# Patient Record
Sex: Male | Born: 1950 | Race: White | Hispanic: No | Marital: Married | Smoking: Former smoker
Health system: Southern US, Community
[De-identification: ages and names within clinical notes are randomized; demographics above are authoritative.]

## PROBLEM LIST (undated history)

## (undated) HISTORY — PX: TONSILLECTOMY: SUR1361

## (undated) HISTORY — PX: SHOULDER SURGERY: SHX246

## (undated) HISTORY — PX: WISDOM TOOTH EXTRACTION: SHX21

---

## 2006-09-19 ENCOUNTER — Ambulatory Visit: Payer: Self-pay | Admitting: Gastroenterology

## 2006-09-19 LAB — HM COLONOSCOPY

## 2009-09-04 ENCOUNTER — Ambulatory Visit: Payer: Self-pay | Admitting: Family Medicine

## 2014-02-05 ENCOUNTER — Encounter: Payer: Self-pay | Admitting: General Surgery

## 2014-02-05 ENCOUNTER — Ambulatory Visit (INDEPENDENT_AMBULATORY_CARE_PROVIDER_SITE_OTHER): Payer: 59 | Admitting: General Surgery

## 2014-02-05 VITALS — BP 126/74 | HR 80 | Resp 12 | Ht 73.0 in | Wt 178.0 lb

## 2014-02-05 DIAGNOSIS — L723 Sebaceous cyst: Secondary | ICD-10-CM

## 2014-02-05 HISTORY — PX: CYST EXCISION: SHX5701

## 2014-02-05 NOTE — Progress Notes (Signed)
Patient ID: Dennis Barrera, male   DOB: 04-24-1951, 63 y.o.   MRN: [id number]  Chief Complaint  Patient presents with  . Other    sebaceous cyst    HPI Dennis Barrera is a 63 y.o. male who presents for an evaluation of a right lower back sebaceous cyst. He states he noticed it approximately 4 years ago. In the last 4 months he started having some soreness and inflammation in this area. He has been able to squeeze this area and has noticed some drainage. The drainage is bloody in color at time and other time he has a yellowish discharge.   HPI  History reviewed. No pertinent past medical history.  History reviewed. No pertinent past surgical history.  History reviewed. No pertinent family history.  Social History History  Substance Use Topics  . Smoking status: Never Smoker   . Smokeless tobacco: Never Used  . Alcohol Use: Yes    No Known Allergies  Current Outpatient Prescriptions  Medication Sig Dispense Refill  . aspirin 81 MG tablet Take 81 mg by mouth daily.       No current facility-administered medications for this visit.    Review of Systems Review of Systems  Constitutional: Negative.   Respiratory: Negative.   Cardiovascular: Negative.     Blood pressure 126/74, pulse 80, resp. rate 12, height 6\' 1"  (1.854 m), weight 178 lb (80.74 kg).  Physical Exam Physical Exam  Skin:     1 cm sebaceous cyst present    Data Reviewed PCP Notes.  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    Plan    The area has occasionally drained thickened material suggestive of cyst contents. Elective excision was recommended and accepted. 10 cc of 0.5% Xylocaine with 0.25% Marcaine with 1-200,000 units of epinephrine was utilized well tolerated. The area was cleaned with ChloraPrep and draped. An elliptical incision was used to remove the cyst and the central pore. Was closed with interrupted 4-0 nylon sutures. Telfa and Tegaderm dressing.  Postoperative wound care reviewed.  The patient return in one week for suture removal to staff.    PCP/Ref. MD: Miguel Aschoff   Robert Bellow 02/06/2014, 8:18 PM

## 2014-02-05 NOTE — Patient Instructions (Signed)
Patient to return in 1 week for nurse visit. The patient is aware to call back for any questions or concerns.  

## 2014-02-06 DIAGNOSIS — L723 Sebaceous cyst: Secondary | ICD-10-CM | POA: Insufficient documentation

## 2014-02-08 LAB — PATHOLOGY

## 2014-02-11 ENCOUNTER — Telehealth: Payer: Self-pay

## 2014-02-11 NOTE — Telephone Encounter (Signed)
Message copied by Lesly Rubenstein on Mon Feb 11, 2014  4:31 PM ------      Message from: [place], Forest Gleason      Created: Mon Feb 11, 2014  2:11 PM       Clinical impression was that of a sebaceous cyst. No cyst wall identified on resection. We'll plan for a followup exam in 6 weeks.            Caryl-Lyn: please notify the patient the pathology was benign. I would like to have a brief position exam in 6 weeks to assess healing. Thank you ------

## 2014-02-11 NOTE — Telephone Encounter (Signed)
Message left for patient to call back for results.

## 2014-02-12 ENCOUNTER — Ambulatory Visit (INDEPENDENT_AMBULATORY_CARE_PROVIDER_SITE_OTHER): Payer: Self-pay | Admitting: *Deleted

## 2014-02-12 DIAGNOSIS — L723 Sebaceous cyst: Secondary | ICD-10-CM

## 2014-02-12 NOTE — Patient Instructions (Signed)
Bandaid as needed

## 2014-02-12 NOTE — Progress Notes (Addendum)
Patient came in today for a wound check.  The wound is clean, with no signs of infection noted. Sutures removed and steri strips applied. Follow up as scheduled.

## 2014-03-26 ENCOUNTER — Ambulatory Visit (INDEPENDENT_AMBULATORY_CARE_PROVIDER_SITE_OTHER): Payer: Self-pay | Admitting: General Surgery

## 2014-03-26 ENCOUNTER — Encounter: Payer: Self-pay | Admitting: General Surgery

## 2014-03-26 VITALS — BP 130/70 | HR 80 | Resp 12 | Ht 73.0 in | Wt 205.0 lb

## 2014-03-26 DIAGNOSIS — L723 Sebaceous cyst: Secondary | ICD-10-CM

## 2014-03-26 NOTE — Progress Notes (Signed)
Patient ID: Dennis Barrera, male   DOB: [birth year], 63 y.o.   MRN: [id number]  Chief Complaint  Patient presents with  . Follow-up    6 week post op lower back cyst excision    HPI Dennis Barrera is a 63 y.o. male who presents for a post op lower back cyst excision. The procedure was performed on 02/05/14. The patient is doing well. No new complaints at this time.   HPI  No past medical history on file.  Past Surgical History  Procedure Laterality Date  . Cyst excision  02/05/14    lower back    No family history on file.  Social History History  Substance Use Topics  . Smoking status: Never Smoker   . Smokeless tobacco: Never Used  . Alcohol Use: Yes    No Known Allergies  Current Outpatient Prescriptions  Medication Sig Dispense Refill  . aspirin 81 MG tablet Take 81 mg by mouth daily.     No current facility-administered medications for this visit.    Review of Systems Review of Systems  Blood pressure 130/70, pulse 80, resp. rate 12, height 6\' 1"  (1.854 m), weight 205 lb (92.987 kg).  Physical Exam Physical Exam  Constitutional: He is oriented to person, place, and time. He appears well-developed and well-nourished.  Neurological: He is alert and oriented to person, place, and time.  Skin: Skin is warm and dry.     Drainage is noted at the site.     Data Reviewed Diagnosis: RIGHT POSTERIOR BACK SEBACEOUS CYST: - SKIN AND SUBCUTIS WITHOUT EVIDENCE OF SEBACEOUS CYST. - NEGATIVE FOR DYSPLASIA AND MALIGNANCY. Note: There is extension of mature adipose tissue within the dermis suggestive of a lipoma. Multiple additional deeper H/E levels were examined. XDB/02/08/2014  Assessment    Persistent cyst wall, warranting reexcision.    Plan    The original pathology showed no cystic material, considering the ongoing drainage, I think the cyst wall is still present and would be best served by removal. The patient will find a time when it's  convenient to have this completed as an office procedure.    PCP/Ref MD:  Philemon Kingdom 03/28/2014, 6:35 AM

## 2014-03-26 NOTE — Patient Instructions (Signed)
The patient is aware to call back for any questions or concerns.  

## 2014-04-16 ENCOUNTER — Encounter: Payer: Self-pay | Admitting: General Surgery

## 2014-04-16 ENCOUNTER — Ambulatory Visit (INDEPENDENT_AMBULATORY_CARE_PROVIDER_SITE_OTHER): Payer: 59 | Admitting: General Surgery

## 2014-04-16 VITALS — BP 138/86 | HR 76 | Resp 12 | Ht 73.0 in | Wt 205.0 lb

## 2014-04-16 DIAGNOSIS — L723 Sebaceous cyst: Secondary | ICD-10-CM

## 2014-04-16 NOTE — Patient Instructions (Signed)
Keep area clean 

## 2014-04-16 NOTE — Progress Notes (Signed)
Patient ID: Dennis Barrera, male   DOB: Jul 02, 1950, 64 y.o.   MRN: [id number]  Chief Complaint  Patient presents with  . Procedure    reexcision lower back cyst    HPI Dennis Barrera is a 63 y.o. male here today for a re excision lower back cyst.  HPI  No past medical history on file.  Past Surgical History  Procedure Laterality Date  . Cyst excision  02/05/14    lower back    No family history on file.  Social History History  Substance Use Topics  . Smoking status: Never Smoker   . Smokeless tobacco: Never Used  . Alcohol Use: Yes    No Known Allergies  Current Outpatient Prescriptions  Medication Sig Dispense Refill  . aspirin 81 MG tablet Take 81 mg by mouth as needed.      No current facility-administered medications for this visit.    Review of Systems Review of Systems  Constitutional: Negative.   Respiratory: Negative.   Cardiovascular: Negative.     Blood pressure 138/86, pulse 76, resp. rate 12, height 6\' 1"  (1.854 m), weight 205 lb (92.987 kg).  Physical Exam Physical Exam Examination shows the previous drainage site to be well healed. Just to the right of this is a small punctate opening, source of previously noted drainage.  Data Reviewed Cyst wall not identified on original pathologic material.   Assessment    Residual dermal cyst.    Plan    The area was prepped with alcohol and 10 mL of 0.5% Xylocaine with 0.25% Marcaine with 1-200,000 epinephrine was utilized for local anesthesia well tolerated. ChloraPrep was applied to the skin. Through elliptical incision the old scar as well as the small punctate opening where drainage has been noted were removed. The cyst wall was identified and completely resected. The deep tissue was approximately with interrupted 3-0 Vicryl sutures. The skin was closed with interrupted 4-0 nylon sutures. A dry dressing with Telfa and Tegaderm was applied.  The patient [name] procedure well. He'll return in  one week for suture removal. He'll make use of Tylenol or OTC anti-inflammatories if needed for pain.    PCP:  Philemon Kingdom 04/17/2014, 10:45 AM

## 2014-04-23 ENCOUNTER — Ambulatory Visit (INDEPENDENT_AMBULATORY_CARE_PROVIDER_SITE_OTHER): Payer: Self-pay | Admitting: *Deleted

## 2014-04-23 DIAGNOSIS — L723 Sebaceous cyst: Secondary | ICD-10-CM

## 2014-04-23 NOTE — Progress Notes (Signed)
Patient came in today for a wound check/suture removal.  Sutures removed steri strips applied. The wound is clean, with no signs of infection noted. Follow up as scheduled.

## 2014-04-23 NOTE — Patient Instructions (Signed)
Keep area clean 

## 2014-10-24 DIAGNOSIS — M5030 Other cervical disc degeneration, unspecified cervical region: Secondary | ICD-10-CM | POA: Insufficient documentation

## 2014-10-24 DIAGNOSIS — M5412 Radiculopathy, cervical region: Secondary | ICD-10-CM | POA: Insufficient documentation

## 2014-10-24 DIAGNOSIS — M47812 Spondylosis without myelopathy or radiculopathy, cervical region: Secondary | ICD-10-CM | POA: Insufficient documentation

## 2014-10-25 ENCOUNTER — Ambulatory Visit: Payer: Self-pay | Admitting: Family Medicine

## 2014-10-25 ENCOUNTER — Encounter: Payer: Self-pay | Admitting: Physician Assistant

## 2014-10-25 ENCOUNTER — Ambulatory Visit (INDEPENDENT_AMBULATORY_CARE_PROVIDER_SITE_OTHER): Payer: 59

## 2014-10-25 VITALS — Temp 98.0°F

## 2014-10-25 DIAGNOSIS — Z23 Encounter for immunization: Secondary | ICD-10-CM

## 2015-05-29 ENCOUNTER — Ambulatory Visit (INDEPENDENT_AMBULATORY_CARE_PROVIDER_SITE_OTHER): Payer: 59 | Admitting: Family Medicine

## 2015-05-29 ENCOUNTER — Encounter: Payer: Self-pay | Admitting: Family Medicine

## 2015-05-29 VITALS — BP 110/66 | HR 78 | Temp 97.9°F | Resp 16 | Ht 73.0 in | Wt 195.0 lb

## 2015-05-29 DIAGNOSIS — Z125 Encounter for screening for malignant neoplasm of prostate: Secondary | ICD-10-CM | POA: Diagnosis not present

## 2015-05-29 DIAGNOSIS — Z0000 Encounter for general adult medical examination without abnormal findings: Secondary | ICD-10-CM | POA: Diagnosis not present

## 2015-05-29 DIAGNOSIS — Z1211 Encounter for screening for malignant neoplasm of colon: Secondary | ICD-10-CM | POA: Diagnosis not present

## 2015-05-29 DIAGNOSIS — N529 Male erectile dysfunction, unspecified: Secondary | ICD-10-CM | POA: Diagnosis not present

## 2015-05-29 LAB — POCT URINALYSIS DIPSTICK
BILIRUBIN UA: NEGATIVE
Blood, UA: NEGATIVE
GLUCOSE UA: NEGATIVE
Ketones, UA: NEGATIVE
LEUKOCYTES UA: NEGATIVE
NITRITE UA: NEGATIVE
Protein, UA: NEGATIVE
Spec Grav, UA: 1.025
Urobilinogen, UA: NEGATIVE
pH, UA: 7

## 2015-05-29 LAB — IFOBT (OCCULT BLOOD): IFOBT: NEGATIVE

## 2015-05-29 MED ORDER — SILDENAFIL CITRATE 20 MG PO TABS: ORAL_TABLET | ORAL | Status: DC

## 2015-05-29 NOTE — Progress Notes (Signed)
Patient ID: Dennis Barrera, male   DOB: 1950-09-18, 65 y.o.   MRN: [id number]  Visit Date: 05/29/2015  Today's Provider: [name], MD   Chief Complaint  Patient presents with  . Annual Exam   Subjective:  Dennis Barrera is a 65 y.o. male who presents today for health maintenance and complete physical. He feels well. He reports exercising not much-walking a few times a week.Marland Kitchen He reports he is sleeping well. LAST Tdap 10/25/14.  No flu shot this year.  Colonoscopy 09/19/06 normal.  Review of Systems  Constitutional: Negative.   HENT: Positive for congestion.   Eyes: Negative.   Respiratory: Negative.   Cardiovascular: Negative.   Gastrointestinal: Negative.   Endocrine: Negative.   Genitourinary: Negative.   Musculoskeletal: Positive for back pain.  Allergic/Immunologic: Negative.   Neurological: Negative.   Hematological: Negative.   Psychiatric/Behavioral: Negative.     Social History   Social History  . Marital Status: Married    Spouse Name: N/A  . Number of Children: N/A  . Years of Education: N/A   Occupational History  . Not on file.   Social History Main Topics  . Smoking status: Never Smoker   . Smokeless tobacco: Never Used  . Alcohol Use: Yes  . Drug Use: No  . Sexual Activity: Not on file   Other Topics Concern  . Not on file   Social History Narrative    Patient Active Problem List   Diagnosis Date Noted  . Cervical spondylosis without myelopathy 10/24/2014  . 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 Degeneration of cervical intervertebral disc 10/24/2014  . Sebaceous cyst 02/06/2014    Past Surgical History  Procedure Laterality Date  . Cyst excision  02/05/14    lower back  . Tonsillectomy    . Wisdom tooth extraction    . Shoulder surgery      His family history includes Heart disease in his maternal grandfather; Hypertension in his brother and mother; Osteoarthritis in his father; Stroke in his father.     Outpatient Prescriptions Prior to Visit  Medication Sig Dispense Refill  . aspirin 81 MG tablet Take 81 mg by mouth as needed.      No facility-administered medications prior to visit.    Patient Care Team: Jerrol Banana., MD as PCP - General (Family Medicine) Jerrol Banana., MD (Family Medicine) Robert Bellow, MD (General Surgery)     Objective:   Vitals:  Filed Vitals:   05/29/15 1040  BP: 110/66  Pulse: 78  Temp: 97.9 F (36.6 C)  Resp: 16  Height: 6\' 1"  (1.854 m)  Weight: 195 lb (88.451 kg)    Physical Exam  Constitutional: He is oriented to person, place, and time. He appears well-developed and well-nourished.  HENT:  Head: Normocephalic and atraumatic.  Right Ear: External ear normal.  Left Ear: External ear normal.  Nose: Nose normal.  Mouth/Throat: Oropharynx is clear and moist.  Eyes: Conjunctivae are normal. Pupils are equal, round, and reactive to light.  Neck: Normal range of motion. Neck supple.  Cardiovascular: Normal rate, regular rhythm, normal heart sounds and intact distal pulses.   Pulmonary/Chest: Effort normal and breath sounds normal. No respiratory distress. He has no wheezes.  Abdominal: Soft. Bowel sounds are normal. He exhibits no distension. There is no tenderness.  Genitourinary: Rectum normal, prostate normal and penis normal. Guaiac negative stool. No penile tenderness.  Musculoskeletal: Normal range of motion. He exhibits no edema or  tenderness.  Neurological: He is alert and oriented to person, place, and time.  Skin: Skin is warm and dry. No rash noted.  Psychiatric: He has a normal mood and affect. His behavior is normal. Judgment and thought content normal.     Depression Screen PHQ 2/9 Scores 05/29/2015  PHQ - 2 Score 0      Assessment & Plan:   1. Annual physical exam Need to get shingels shot on the next visit. - CBC with Differential/Platelet - Lipid Panel With LDL/HDL Ratio - POCT urinalysis  dipstick - TSH - Comprehensive metabolic panel Patient's wife retired January 1.patient will probably retire in the next 2-3 years. 2. Colon cancer screening - IFOBT POC (occult bld, rslt in office)  3. Prostate cancer screening - PSA  4. Erectile dysfunction, unspecified erectile dysfunction type Discussed risk and benefits of Viagra at length with patient. Start medication. Never tried anything else. - sildenafil (REVATIO) 20 MG tablet; 2 to 4 tablets daily as needed  Dispense: 50 tablet; Refill: 5    Patient was seen and examined by Dr. Eulas Post and note was scribed by Theressa Millard, RMA.

## 2015-06-06 LAB — COMPREHENSIVE METABOLIC PANEL
ALK PHOS: 75 IU/L (ref 39–117)
ALT: 11 IU/L (ref 0–44)
AST: 16 IU/L (ref 0–40)
Albumin/Globulin Ratio: 2 (ref 1.1–2.5)
Albumin: 4.4 g/dL (ref 3.6–4.8)
BILIRUBIN TOTAL: 0.6 mg/dL (ref 0.0–1.2)
BUN/Creatinine Ratio: 13 (ref 10–22)
BUN: 12 mg/dL (ref 8–27)
CHLORIDE: 101 mmol/L (ref 96–106)
CO2: 22 mmol/L (ref 18–29)
CREATININE: 0.92 mg/dL (ref 0.76–1.27)
Calcium: 9.1 mg/dL (ref 8.6–10.2)
GFR calc Af Amer: 102 mL/min/{1.73_m2} (ref >59)
GFR calc non Af Amer: 88 mL/min/{1.73_m2} (ref >59)
GLUCOSE: 113 mg/dL — AB (ref 65–99)
Globulin, Total: 2.2 g/dL (ref 1.5–4.5)
Potassium: 4.5 mmol/L (ref 3.5–5.2)
SODIUM: 141 mmol/L (ref 134–144)
Total Protein: 6.6 g/dL (ref 6.0–8.5)

## 2015-06-06 LAB — LIPID PANEL WITH LDL/HDL RATIO
CHOLESTEROL TOTAL: 209 mg/dL — AB (ref 100–199)
HDL: 50 mg/dL (ref >39)
LDL Calculated: 141 mg/dL — ABNORMAL HIGH (ref 0–99)
LDl/HDL Ratio: 2.8 ratio units (ref 0.0–3.6)
TRIGLYCERIDES: 90 mg/dL (ref 0–149)
VLDL Cholesterol Cal: 18 mg/dL (ref 5–40)

## 2015-06-06 LAB — CBC WITH DIFFERENTIAL/PLATELET
BASOS: 1 %
Basophils Absolute: 0.1 10*3/uL (ref 0.0–0.2)
EOS (ABSOLUTE): 0.1 10*3/uL (ref 0.0–0.4)
Eos: 2 %
Hematocrit: 43.3 % (ref 37.5–51.0)
Hemoglobin: 14.5 g/dL (ref 12.6–17.7)
Immature Grans (Abs): 0 10*3/uL (ref 0.0–0.1)
Immature Granulocytes: 0 %
LYMPHS: 34 %
Lymphocytes Absolute: 1.8 10*3/uL (ref 0.7–3.1)
MCH: 28.2 pg (ref 26.6–33.0)
MCHC: 33.5 g/dL (ref 31.5–35.7)
MCV: 84 fL (ref 79–97)
MONOCYTES: 7 %
Monocytes Absolute: 0.3 10*3/uL (ref 0.1–0.9)
NEUTROS PCT: 56 %
Neutrophils Absolute: 3 10*3/uL (ref 1.4–7.0)
Platelets: 336 10*3/uL (ref 150–379)
RBC: 5.15 x10E6/uL (ref 4.14–5.80)
RDW: 13.1 % (ref 12.3–15.4)
WBC: 5.3 10*3/uL (ref 3.4–10.8)

## 2015-06-06 LAB — PSA: Prostate Specific Ag, Serum: 1.1 ng/mL (ref 0.0–4.0)

## 2015-06-06 LAB — TSH: TSH: 1.93 u[IU]/mL (ref 0.450–4.500)

## 2015-06-10 ENCOUNTER — Telehealth: Payer: Self-pay

## 2015-06-10 NOTE — Telephone Encounter (Signed)
Advised patient as of lab results. Patient would like to work on diet and exercise first before starting any medication.

## 2015-06-10 NOTE — Telephone Encounter (Signed)
Left message to call back  

## 2015-06-10 NOTE — Telephone Encounter (Signed)
-----   Message from Jerrol Banana., MD sent at 06/10/2015  8:14 AM EST ----- Labs stable. Cholesterol mildly elevated and patient is prediabetic. Diet and exercise as discussed during visit. It will be a reasonable continue consider metformin for the prediabetes as this could slow this issue down from becoming diabetes proper.

## 2015-07-22 ENCOUNTER — Ambulatory Visit
Admission: RE | Admit: 2015-07-22 | Discharge: 2015-07-22 | Disposition: A | Discharge status: Home / Self Care | Payer: 59 | Source: Ambulatory Visit | Attending: Family Medicine | Admitting: Family Medicine

## 2015-07-22 ENCOUNTER — Encounter: Payer: Self-pay | Admitting: Family Medicine

## 2015-07-22 ENCOUNTER — Ambulatory Visit (INDEPENDENT_AMBULATORY_CARE_PROVIDER_SITE_OTHER): Payer: 59 | Admitting: Family Medicine

## 2015-07-22 ENCOUNTER — Telehealth: Payer: Self-pay | Admitting: Family Medicine

## 2015-07-22 VITALS — BP 118/72 | HR 78 | Temp 98.4°F | Resp 18 | Wt 195.0 lb

## 2015-07-22 DIAGNOSIS — M25552 Pain in left hip: Secondary | ICD-10-CM | POA: Insufficient documentation

## 2015-07-22 DIAGNOSIS — M545 Low back pain: Secondary | ICD-10-CM | POA: Diagnosis not present

## 2015-07-22 MED ORDER — NAPROXEN 500 MG PO TABS: 500.0000 mg | ORAL_TABLET | Freq: Two times a day (BID) | ORAL | Status: DC

## 2015-07-22 MED ORDER — CYCLOBENZAPRINE HCL 10 MG PO TABS: 10.0000 mg | ORAL_TABLET | Freq: Every day | ORAL | Status: DC

## 2015-07-22 NOTE — Progress Notes (Signed)
Patient ID: Dennis Barrera, male   DOB: [birth year], 65 y.o.   MRN: [id number]    Subjective:  HPI Pt is here today for back pain. He has had back pain in the past, the initial injury was about 9 months ago. He was working in the yard, twisting and pulling. He came in, but not have x-rays but he was able to relief some the pain with back stretches. He reports that about 3 weeks ago the pain got worse. He can not remember anything that would have aggravated it again other than some stretches which seem to make it worse this time. He reports that this time he also has a pain that will shoot into his hip. It is a sharp shooting pain and sometimes it makes him stop in his tracks and last for a little while. Last night he could not get it to go away, he could not get comfortable to sleep.    Prior to Admission medications   Medication Sig Start Date End Date Taking? Authorizing Provider  aspirin 81 MG tablet Take 81 mg by mouth as needed.    Yes Historical Provider, MD  Naproxen Sodium (ALEVE) 220 MG CAPS Take by mouth 1 day or 1 dose.   Yes Historical Provider, MD    Patient Active Problem List   Diagnosis Date Noted  . Cervical spondylosis without myelopathy 10/24/2014  . 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 Degeneration of cervical intervertebral disc 10/24/2014  . Sebaceous cyst 02/06/2014    History reviewed. No pertinent past medical history.  Social History   Social History  . Marital Status: Married    Spouse Name: N/A  . Number of Children: N/A  . Years of Education: N/A   Occupational History  . Not on file.   Social History Main Topics  . Smoking status: Never Smoker   . Smokeless tobacco: Never Used  . Alcohol Use: Yes  . Drug Use: No  . Sexual Activity: Not on file   Other Topics Concern  . Not on file   Social History Narrative    No Known Allergies  Review of Systems  Constitutional: Negative.   HENT: Negative.   Eyes: Negative.   Respiratory:  Negative.   Cardiovascular: Negative.   Gastrointestinal: Negative.   Genitourinary: Negative.   Musculoskeletal: Positive for back pain.  Skin: Negative.   Neurological: Negative.   Endo/Heme/Allergies: Negative.   Psychiatric/Behavioral: Negative.     Immunization History  Administered Date(s) Administered  . Tdap 10/25/2014   Objective:  BP 118/72 mmHg  Pulse 78  Temp(Src) 98.4 F (36.9 C) (Oral)  Resp 18  Wt 195 lb (88.451 kg)  Physical Exam  Constitutional: He is oriented to person, place, and time and well-developed, well-nourished, and in no distress.  HENT:  Head: Normocephalic and atraumatic.  Right Ear: External ear normal.  Left Ear: External ear normal.  Nose: Nose normal.  Eyes: Conjunctivae and EOM are normal. Pupils are equal, round, and reactive to light.  Neck: Normal range of motion. Neck supple.  Cardiovascular: Normal rate, regular rhythm, normal heart sounds and intact distal pulses.   Pulmonary/Chest: Effort normal and breath sounds normal.  Abdominal: Soft.  Musculoskeletal: Normal range of motion.  Figure 4 negative, leg raise negative. Strength normal. Limited flexion.  Neurological: He is alert and oriented to person, place, and time. He has normal reflexes. Gait normal. GCS score is 15.  Skin: Skin is warm and dry.  Psychiatric: Mood, memory, affect  and judgment normal.    Lab Results  Component Value Date   WBC 5.3 06/05/2015   HCT 43.3 06/05/2015   PLT 336 06/05/2015   GLUCOSE 113* 06/05/2015   CHOL 209* 06/05/2015   TRIG 90 06/05/2015   HDL 50 06/05/2015   LDLCALC 141* 06/05/2015   TSH 1.930 06/05/2015    CMP     Component Value Date/Time   NA 141 06/05/2015 0842   K 4.5 06/05/2015 0842   CL 101 06/05/2015 0842   CO2 22 06/05/2015 0842   GLUCOSE 113* 06/05/2015 0842   BUN 12 06/05/2015 0842   CREATININE 0.92 06/05/2015 0842   CALCIUM 9.1 06/05/2015 0842   PROT 6.6 06/05/2015 0842   ALBUMIN 4.4 06/05/2015 0842   AST 16  06/05/2015 0842   ALT 11 06/05/2015 0842   ALKPHOS 75 06/05/2015 0842   BILITOT 0.6 06/05/2015 0842   GFRNONAA 88 06/05/2015 0842   GFRAA 102 06/05/2015 0842     Assessment and Plan :  1. Low back pain, unspecified back pain laterality, with sciatica presence unspecified  - DG Lumbar Spine 2-3 Views; Future - DG HIP INFANT UNILAT W OR W/O PELVIS 2-3 VIEWS LEFT; Future - naproxen (NAPROSYN) 500 MG tablet; Take 1 tablet (500 mg total) by mouth 2 (two) times daily with a meal.  Dispense: 60 tablet; Refill: 4 - cyclobenzaprine (FLEXERIL) 10 MG tablet; Take 1 tablet (10 mg total) by mouth at bedtime.  Dispense: 30 tablet; Refill: 4  2. Hip pain, left  - DG Lumbar Spine 2-3 Views; Future - DG HIP INFANT UNILAT W OR W/O PELVIS 2-3 VIEWS LEFT; Future - naproxen (NAPROSYN) 500 MG tablet; Take 1 tablet (500 mg total) by mouth 2 (two) times daily with a meal.  Dispense: 60 tablet; Refill: 4  May need referral to ortho or PT depending on xray results. Discussed risk of NSAID use.   Patient was seen and examined by Dr. Miguel Aschoff, and noted scribed by Webb Laws, [place] MD [facility] 07/22/2015 9:03 AM

## 2015-08-26 ENCOUNTER — Ambulatory Visit (INDEPENDENT_AMBULATORY_CARE_PROVIDER_SITE_OTHER): Payer: 59 | Admitting: Family Medicine

## 2015-08-26 VITALS — BP 124/76 | HR 84 | Resp 16 | Wt 198.0 lb

## 2015-08-26 DIAGNOSIS — M25552 Pain in left hip: Secondary | ICD-10-CM

## 2015-08-26 DIAGNOSIS — M545 Low back pain: Secondary | ICD-10-CM

## 2015-08-26 NOTE — Progress Notes (Signed)
Patient ID: Dennis Barrera, male   DOB: Sep 28, 1950, 65 y.o.   MRN: [id number]    Subjective:  HPI  Patient is here for follow up on his back pain. Last visit was in March 21st and he was started on Naproxen and has been taking it twice daily. He did not have to take Flexeril so far. Lumbar Spine and Hip xray was done and showed arthritis and DDD. Patient states he is 64% better but he still has episodes of pain shooting mainly through the left side of the back, hip and leg.   Prior to Admission medications   Medication Sig Start Date End Date Taking? Authorizing Provider  aspirin 81 MG tablet Take 81 mg by mouth as needed.    Yes Historical Provider, MD  naproxen (NAPROSYN) 500 MG tablet Take 1 tablet (500 mg total) by mouth 2 (two) times daily with a meal. 07/22/15  Yes Jerrol Banana., MD  cyclobenzaprine (FLEXERIL) 10 MG tablet Take 1 tablet (10 mg total) by mouth at bedtime. Patient not taking: Reported on 08/26/2015 07/22/15   Jerrol Banana., MD    Patient Active Problem List   Diagnosis Date Noted  . Cervical spondylosis without myelopathy 10/24/2014  . 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 Degeneration of cervical intervertebral disc 10/24/2014  . Sebaceous cyst 02/06/2014    No past medical history on file.  Social History   Social History  . Marital Status: Married    Spouse Name: N/A  . Number of Children: N/A  . Years of Education: N/A   Occupational History  . Not on file.   Social History Main Topics  . Smoking status: Never Smoker   . Smokeless tobacco: Never Used  . Alcohol Use: Yes  . Drug Use: No  . Sexual Activity: Not on file   Other Topics Concern  . Not on file   Social History Narrative    No Known Allergies  Review of Systems  Constitutional: Negative.   Respiratory: Negative.   Cardiovascular: Negative.   Musculoskeletal: Positive for back pain and joint pain.    Immunization History  Administered Date(s)  Administered  . Tdap 10/25/2014   Objective:  BP 124/76 mmHg  Pulse 84  Resp 16  Wt 198 lb (89.812 kg)  Physical Exam  Constitutional: He is oriented to person, place, and time and well-developed, well-nourished, and in no distress.  Cardiovascular: Normal rate, regular rhythm, normal heart sounds and intact distal pulses.   No murmur heard. Pulmonary/Chest: Effort normal and breath sounds normal. No respiratory distress. He has no wheezes.  Musculoskeletal: He exhibits no edema.  Neurological: He is alert and oriented to person, place, and time. He displays normal reflexes. He exhibits normal muscle tone. Gait normal. Coordination normal.  Minimally tender over/above left sciatic notch. Straight leg raise is negative. strength is normal in both lower extremities.  Skin: Skin is warm and dry.  Psychiatric: Mood, memory, affect and judgment normal.    Lab Results  Component Value Date   WBC 5.3 06/05/2015   HCT 43.3 06/05/2015   PLT 336 06/05/2015   GLUCOSE 113* 06/05/2015   CHOL 209* 06/05/2015   TRIG 90 06/05/2015   HDL 50 06/05/2015   LDLCALC 141* 06/05/2015   TSH 1.930 06/05/2015    CMP     Component Value Date/Time   NA 141 06/05/2015 0842   K 4.5 06/05/2015 0842   CL 101 06/05/2015 0842   CO2 22 06/05/2015  [id number]   GLUCOSE 113* 06/05/2015 0842   BUN 12 06/05/2015 0842   CREATININE 0.92 06/05/2015 0842   CALCIUM 9.1 06/05/2015 0842   PROT 6.6 06/05/2015 0842   ALBUMIN 4.4 06/05/2015 0842   AST 16 06/05/2015 0842   ALT 11 06/05/2015 0842   ALKPHOS 75 06/05/2015 0842   BILITOT 0.6 06/05/2015 0842   GFRNONAA 88 06/05/2015 0842   GFRAA 102 06/05/2015 0842    Assessment and Plan :  1. Low back pain, unspecified back pain laterality, with sciatica presence unspecified 60 % improved. Symptoms are off and on. Discussed with patient getting referred to physical therapy for further treatment, patient wants to go ahead and get referred. Advised patient to continue  Naproxen for another week. May need MRI and referral to specialist but think physical therapy will give him that he needs. 2. Hip pain, left I think all the pain is from his back. I have done the exam and reviewed the above chart and it is accurate to the best of my knowledge.  Patient was seen and examined by Dr. Eulas Post and note was scribed by Theressa Millard, RMA.    Miguel Aschoff MD [facility] 08/26/2015 4:13 PM

## 2015-09-08 ENCOUNTER — Ambulatory Visit: Payer: 59 | Attending: Family Medicine | Admitting: Physical Therapy

## 2015-09-08 DIAGNOSIS — M5442 Lumbago with sciatica, left side: Secondary | ICD-10-CM | POA: Insufficient documentation

## 2015-09-08 DIAGNOSIS — M25552 Pain in left hip: Secondary | ICD-10-CM | POA: Diagnosis present

## 2015-09-08 NOTE — Patient Instructions (Signed)
All exercises provided were adapted from [email or web address]. Patient was provided a written handout with pictures as described. Any additional cues were manually entered in to handout and copied in to this document.  Piriformis Stretch  Flex hip up towards body and pull across body towards opposite side.    Double Knee to chest stretch  Pull your knees up to your chest hold for 30 seconds then relax. Repeat  Continue to breathe throughout stretch.  Quadricep Stretch  Pull heel toward buttock until a stretch is felt in front of thigh.  Prevent back from arching.  Repeat on both sides.

## 2015-09-08 NOTE — Therapy (Signed)
[facility] [address] [place], [address] Phone: [phone]   Fax:  [phone]  Physical Therapy Evaluation  Patient Details  Name: Dennis Barrera MRN: [id number] Date of Birth: [birth year] No Data Recorded  Encounter Date: 09/08/2015      PT End of Session - 09/08/15 1235    Visit Number 1   Number of Visits 9   Date for PT Re-Evaluation 10/13/15   PT Start Time 1017   PT Stop Time 1115   PT Time Calculation (min) 58 min   Activity Tolerance Patient tolerated treatment well   Behavior During Therapy [facility] for tasks assessed/performed      No past medical history on file.  Past Surgical History  Procedure Laterality Date  . Cyst excision  02/05/14    lower back  . Tonsillectomy    . Wisdom tooth extraction    . Shoulder surgery      There were no vitals filed for this visit.       Subjective Assessment - 09/08/15 1246    Subjective Patient reports that 2-3 months ago he was on his knees pulling/twisting pipes and felt a more sharp pain and numbness in his lower back, which began radiating down his posterior legs (though not below his knees). It progressed to a burning/tightness and felt like coals when he initially sat down. He still feels a twinge with lower trunk rotations to the L. He has decreased his physical activity significantly and not played golf. It has improved ~ 70% since onset and he iced it last night and is now feeling the best he has since the episode started.    Pertinent History Patient had a prior episode which felt more "musculaur" which resolved with regular exercise and therapy over the course of several years.    Limitations Lifting;Sitting;Standing;Walking   How long can you stand comfortably? 3-4 hours    Diagnostic tests X-ray of hips - negative, of spine indicative of early arthritis.    Patient Stated Goals To be able to golf again.    Currently in Pain? Yes   Pain Score 3    Pain Location Back   Pain Orientation Left;Lower   Pain Descriptors / Indicators Aching;Tightness   Pain Type Chronic pain   Pain Radiating Towards Bilateral LEs, L> R   Pain Onset More than a month ago   Pain Frequency Intermittent   Aggravating Factors  Prolonged standing, sitting, walking    Pain Relieving Factors Ice             [facility] PT Assessment - 09/08/15 1236    Assessment   Medical Diagnosis --  Low back pain with sciatica, L hip pain   Precautions   Precautions None   Restrictions   Weight Bearing Restrictions No   Balance Screen   Has the patient fallen in the past 6 months No   Has the patient had a decrease in activity level because of a fear of falling?  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    Leisure --  Likes to Furniture conservator/restorer   Overall Cognitive Status Within Functional Limits for tasks assessed   Observation/Other Assessments   Lower Extremity Functional Scale  37   Sensation   Light Touch Appears Intact   Posture/Postural Control  Posture Comments --  Very subtle R sided shift   Palpation   Spinal mobility --  L4 hypomobile, produced cold sensation in L glute   Palpation comment --  No tenderness in piriformis/glute    FABER test   findings Negative   Slump test   Findings Negative   Straight Leg Raise   Findings Negative   Criag's Test    Findings Negative   Comments --  Some pain on L with ER   Ely's Test   Findings Positive   Side Left   Ambulation/Gait   Gait Comments --  Decreased L hip extension relative to R      L quadricep stretch in standing by PT 5 bouts x 30" with notable tightness reported in anterior thigh.  Piriformis stretch bilaterally 4 bouts x 30" with PT overpressure causing stretching sensation in lateral hip musculature.  Double knee to chest 2 bouts x 12 repetitions with 2"  holds.  Sustained manual CPAs over L4 grade II for 2 bouts x 25-45".    Patient noted some relief of symptoms with flexion/extension testing after these (though continues to report some pain).                     PT Education - 09/08/15 1228    Education provided Yes   Education Details Patient educated on role of decreased hip/lumbar AROM to symptoms, time frame, HEP   Person(s) Educated Patient   Methods Explanation;Demonstration;Handout   Comprehension Returned demonstration;Verbalized understanding             PT Long Term Goals - 09/08/15 1242    PT LONG TERM GOAL #1   Title Patient will report an LEFS score of greater than 50/80 to demonstrate improved tolerance for ADLs.    Baseline 37/80   Time 4   Period Weeks   Status New   PT LONG TERM GOAL #2   Title Patient will demonstrate full AROM in all planes of lumbar motion with no increase in symptoms for return to ADLs.    Baseline Pain with flexion, extension, R side bend    Time 4   Period Weeks   Status New   PT LONG TERM GOAL #3   Title Patient will return to playing golf with no increase in symptoms to return to recreational activities.    Time 4   Period Weeks   Status New   PT LONG TERM GOAL #4   Title Patient will report worst pain over the last 3 days to be no more than 3/10 to demonstrate improved tolerance for ADLs.    Baseline 8/10   Time 4   Period Weeks   Status New               Plan - 09/08/15 1229    Clinical Impression Statement Patient is a pleasant 65 y/o male that presents with recent onset of low back/posterior leg pain that does not extend beyond the knees. He reports it has been improving on its own, though PT notes significant difference in hip extension in gait (L decreased to R) and confirmed with Ely's test (L hip ROM less than R) which reproduces his symptoms. He also reports a cold sensation in his hip with CPAs over L4, no other neurologic signs identified.  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    Rehab Potential  Good   Clinical Impairments Affecting Rehab Potential Improvement of 70% already in symptom intensity and frequency    PT Frequency 2x / week   PT Duration 4 weeks   PT Next Visit Plan L hip flexor stretching/L quad stretching, posterior hip/lumbar stretching    PT Home Exercise Plan See patient instructions.    Consulted and Agree with Plan of Care Patient      Patient will benefit from skilled therapeutic intervention in order to improve the following deficits and impairments:  Abnormal gait, Pain, Hypomobility  Visit Diagnosis: Lumbago with sciatica, left side - Plan: PT plan of care cert/re-cert  Pain in left hip - Plan: PT plan of care cert/re-cert     Problem List Patient Active Problem List   Diagnosis Date Noted  . Cervical spondylosis without myelopathy 10/24/2014  . 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 Degeneration of cervical intervertebral disc 10/24/2014  . Sebaceous cyst 02/06/2014   Kerman Passey, PT, DPT    09/08/2015, 4:46 PM  [facility] [address] [place], [address] Phone: [phone]   Fax:  [phone]  Name: Dennis Barrera MRN: [id number] Date of Birth: 1951/03/25

## 2015-09-11 ENCOUNTER — Ambulatory Visit: Payer: 59 | Admitting: Physical Therapy

## 2015-09-11 DIAGNOSIS — M5442 Lumbago with sciatica, left side: Secondary | ICD-10-CM

## 2015-09-11 DIAGNOSIS — M25552 Pain in left hip: Secondary | ICD-10-CM

## 2015-09-12 NOTE — Therapy (Signed)
[facility] [address] [place], [address] Phone: [phone]   Fax:  [phone]  Physical Therapy Treatment  Patient Details  Name: Dennis Barrera MRN: [id number] Date of Birth: 1950/11/23 No Data Recorded  Encounter Date: 09/11/2015      PT End of Session - 09/16/15 1324    Visit Number 2   Number of Visits 9   Date for PT Re-Evaluation 10/13/15   PT Start Time [id number]   PT Stop Time 1828   PT Time Calculation (min) 42 min   Activity Tolerance Patient tolerated treatment well   Behavior During Therapy [facility] for tasks assessed/performed      No past medical history on file.  Past Surgical History  Procedure Laterality Date  . Cyst excision  02/05/14    lower back  . Tonsillectomy    . Wisdom tooth extraction    . Shoulder surgery      There were no vitals filed for this visit.      Subjective Assessment - 09/16/15 1325    Subjective Patient reports his symptoms have subsided for the most part, he continues to have some periods where he feels the "hot ashes" sensation but otherwise reports he is feeling much better. He would still like to get back to golfing at some point this year.    Pertinent History Patient had a prior episode which felt more "musculaur" which resolved with regular exercise and therapy over the course of several years.    Limitations Lifting;Sitting;Standing;Walking   How long can you stand comfortably? 3-4 hours    Diagnostic tests X-ray of hips - negative, of spine indicative of early arthritis.    Patient Stated Goals To be able to golf again.    Currently in Pain? No/denies     Manual Therapy Assess hip IR/ER at 90 degrees flexion in supine (WNL bilaterally)  Long axis distraction 3 bouts x 30" bilaterally (no distinct changes/differences reported)   CPAs over lumbar spine, at L3 in particular, patient reported less pain in this session than previous tx session.  (3 bouts x 45" of grade II, IV)   Manual assistance of stretching quadricep in standing 3 bouts x 30" (reported he felt increase in ROM, decrease in pain from initial session.   PNF stretching of hamstrings on RLE x 3 bouts for 30"        TherEx Assessed golf swing for patient as this is one of his goals to return to. Patient demonstrates a "twinge" of pain on follow through on several swings, educated patient to perform more mobility through hips, less through lumbar spine as it appeared the majority of his ROM at end of swing(s) was from lumbar rotation as opposed to hip rotation,   Prone on elbows 2 sets x 10 repetitions  Supine QL/end range rotation stretch (progression of piriformis stretch) x 10 bilaterally for 2 sets                      PT Education - 09/16/15 1324    Education provided Yes   Education Details Educated patient to resume light golf activities once he has significant reduction in symptoms, progressed HEP.    Person(s) Educated Patient   Methods Explanation   Comprehension Verbalized understanding             PT Long Term Goals - 09/08/15 1242    PT LONG TERM GOAL #1  Title Patient will report an LEFS score of greater than 50/80 to demonstrate improved tolerance for ADLs.    Baseline 37/80   Time 4   Period Weeks   Status New   PT LONG TERM GOAL #2   Title Patient will demonstrate full AROM in all planes of lumbar motion with no increase in symptoms for return to ADLs.    Baseline Pain with flexion, extension, R side bend    Time 4   Period Weeks   Status New   PT LONG TERM GOAL #3   Title Patient will return to playing golf with no increase in symptoms to return to recreational activities.    Time 4   Period Weeks   Status New   PT LONG TERM GOAL #4   Title Patient will report worst pain over the last 3 days to be no more than 3/10 to demonstrate improved tolerance for ADLs.    Baseline 8/10   Time 4   Period Weeks    Status New               Plan - 09/16/15 1324    Clinical Impression Statement Patient reporting significant decrease in intensity and frequency of symptoms with directed treatments already. He demonstrates very mild pain with golf swings at the end of the session, likely indicating some residual deficits in rotational stability/mobility.    Rehab Potential Good   Clinical Impairments Affecting Rehab Potential Improvement of 70% already in symptom intensity and frequency    PT Frequency 2x / week   PT Duration 4 weeks   PT Next Visit Plan L hip flexor stretching/L quad stretching, posterior hip/lumbar stretching. Focus on rotational stretching of both hips as well as lumbar spine. Increase rotational strengthening of hips as tolerated.    PT Home Exercise Plan See patient instructions.    Consulted and Agree with Plan of Care Patient      Patient will benefit from skilled therapeutic intervention in order to improve the following deficits and impairments:  Abnormal gait, Pain, Hypomobility  Visit Diagnosis: Pain in left hip  Lumbago with sciatica, left side     Problem List Patient Active Problem List   Diagnosis Date Noted  . Cervical spondylosis without myelopathy 10/24/2014  . 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 Degeneration of cervical intervertebral disc 10/24/2014  . Sebaceous cyst 02/06/2014   Kerman Passey, PT, DPT    09/16/2015, 1:25 PM  [place] [facility] [address] [place], [address] Phone: [phone]   Fax:  [phone]  Name: Dennis Barrera MRN: [id number] Date of Birth: 30-May-1950

## 2015-09-18 ENCOUNTER — Ambulatory Visit: Payer: 59 | Admitting: Physical Therapy

## 2015-09-18 DIAGNOSIS — M25552 Pain in left hip: Secondary | ICD-10-CM

## 2015-09-18 DIAGNOSIS — M5442 Lumbago with sciatica, left side: Secondary | ICD-10-CM | POA: Diagnosis not present

## 2015-09-19 NOTE — Therapy (Signed)
[facility] [address], [place], [address] Phone: [phone]   Fax:  [phone]  Physical Therapy Treatment  Patient Details  Name: Dennis Barrera MRN: [id number] Date of Birth: [birth year] No Data Recorded  Encounter Date: 09/18/2015      PT End of Session - 09/18/15 1614    Visit Number 3   Number of Visits 9   Date for PT Re-Evaluation 10/13/15   PT Start Time 1548   PT Stop Time 1615   PT Time Calculation (min) 27 min   Activity Tolerance Patient tolerated treatment well   Behavior During Therapy [facility] for tasks assessed/performed      No past medical history on file.  Past Surgical History  Procedure Laterality Date  . Cyst excision  02/05/14    lower back  . Tonsillectomy    . Wisdom tooth extraction    . Shoulder surgery      There were no vitals filed for this visit.      Subjective Assessment - 09/18/15 1551    Subjective Patient reports he has been weaning himself off of Aspirin and was pain free up until today when he really pushed himself walking and sitting all morning he has had slightly more symptoms.     Pertinent History Patient had a prior episode which felt more "musculaur" which resolved with regular exercise and therapy over the course of several years.    Limitations Lifting;Sitting;Standing;Walking   How long can you stand comfortably? 3-4 hours    Diagnostic tests X-ray of hips - negative, of spine indicative of early arthritis.    Patient Stated Goals To be able to golf again.    Currently in Pain? No/denies      Lumbar rotational mobilizations grade III for 15-30" for 5 bouts    Quadricep prone stretching with PNF contract relax x 3 for 45" for  2 bouts each   Single leg deadlift (educated on keeping straight leg and straight back as if picking up a golf ball) x 12 bilaterally                             PT Education - 09/19/15 2021     Education provided Yes   Education Details To gradually return to playing golf, starting with 30-40 shots over the weekend and progressing based on response to that.    Person(s) Educated Patient   Methods Explanation;Demonstration   Comprehension Verbalized understanding             PT Long Term Goals - 09/08/15 1242    PT LONG TERM GOAL #1   Title Patient will report an LEFS score of greater than 50/80 to demonstrate improved tolerance for ADLs.    Baseline 37/80   Time 4   Period Weeks   Status New   PT LONG TERM GOAL #2   Title Patient will demonstrate full AROM in all planes of lumbar motion with no increase in symptoms for return to ADLs.    Baseline Pain with flexion, extension, R side bend    Time 4   Period Weeks   Status New   PT LONG TERM GOAL #3   Title Patient will return to playing golf with no increase in symptoms to return to recreational activities.    Time 4   Period Weeks   Status New   PT LONG TERM GOAL #4  Title Patient will report worst pain over the last 3 days to be no more than 3/10 to demonstrate improved tolerance for ADLs.    Baseline 8/10   Time 4   Period Weeks   Status New               Plan - 09/18/15 1615    Clinical Impression Statement Patient has made excellent strides with therapy. As is common with pain due to loading, he reports increased symptoms (though still below baseline at eval) today after completing more/faster walking yesterday. His ROM is returning to his baseline, he was encouraged to resume recreational activities for further modifications by PT for mobility/exercises required for resolution of symptoms.    Rehab Potential Good   Clinical Impairments Affecting Rehab Potential Improvement of 70% already in symptom intensity and frequency    PT Frequency 2x / week   PT Duration 4 weeks   PT Next Visit Plan L hip flexor stretching/L quad stretching, posterior hip/lumbar stretching. Focus on rotational stretching  of both hips as well as lumbar spine. Increase rotational strengthening of hips as tolerated.    PT Home Exercise Plan See patient instructions.    Consulted and Agree with Plan of Care Patient      Patient will benefit from skilled therapeutic intervention in order to improve the following deficits and impairments:  Abnormal gait, Pain, Hypomobility  Visit Diagnosis: Pain in left hip  Lumbago with sciatica, left side     Problem List Patient Active Problem List   Diagnosis Date Noted  . Cervical spondylosis without myelopathy 10/24/2014  . 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 Degeneration of cervical intervertebral disc 10/24/2014  . Sebaceous cyst 02/06/2014   Kerman Passey, PT, DPT    09/19/2015, 8:24 PM  [facility] [address] [place], [address] Phone: [phone]   Fax:  [phone]  Name: Dennis Barrera MRN: [id number] Date of Birth: 1950-10-31

## 2015-09-23 ENCOUNTER — Ambulatory Visit: Payer: 59 | Admitting: Physical Therapy

## 2015-09-23 DIAGNOSIS — M25552 Pain in left hip: Secondary | ICD-10-CM

## 2015-09-23 DIAGNOSIS — M5442 Lumbago with sciatica, left side: Secondary | ICD-10-CM

## 2015-09-23 NOTE — Therapy (Signed)
[facility] [address] [place], [address] Phone: [phone]   Fax:  [phone]  Physical Therapy Treatment  Patient Details  Name: Dennis Barrera MRN: [id number] Date of Birth: 1950-07-23 No Data Recorded  Encounter Date: 09/23/2015      PT End of Session - 09/23/15 1506    Visit Number 4   Number of Visits 9   Date for PT Re-Evaluation 10/13/15   PT Start Time 1016   PT Stop Time 1059   PT Time Calculation (min) 43 min   Activity Tolerance Patient tolerated treatment well   Behavior During Therapy [facility] for tasks assessed/performed      No past medical history on file.  Past Surgical History  Procedure Laterality Date  . Cyst excision  02/05/14    lower back  . Tonsillectomy    . Wisdom tooth extraction    . Shoulder surgery      There were no vitals filed for this visit.      Subjective Assessment - 09/23/15 1018    Subjective Patient reports he feels he has now plateuad of late. He has found at least 93% improvement, however he continues to have very momentary twinges now though at a much reduced rate from his initial evaluation. He did try swinging the golf club over the weekend and felt "stiff" but not painful/    Pertinent History Patient had a prior episode which felt more "musculaur" which resolved with regular exercise and therapy over the course of several years.    Limitations Lifting;Sitting;Standing;Walking   How long can you stand comfortably? 3-4 hours    Diagnostic tests X-ray of hips - negative, of spine indicative of early arthritis.    Patient Stated Goals To be able to golf again.    Currently in Pain? No/denies      Golf swings x 1 minute for 2 bouts to assess tolerance -- on first bout felt a few "twinges" initially and one large twinge at the end. On second bout, had 1 large(r) twinge initially, then reported much improved stiffness sensation after exercises  completed.  Supine piriformis stretching 2 bouts x 10 repetitions with 5" holds bilaterally.   Single leg deadlifts 2 sets x 10 repetitions feeling a pull/stretch in hamstrings appropriately, cued to maintain neutral spine.   Standing hip abductions with red theraband 2 sets x 10 repetitions bilaterally, appropriate activation   Palloff press with rotation to mimic golf swing with green t-band x 10 bilaterally for 2 sets (felt good sensation while completing to L (mimicing L golf swing).                             PT Education - 09/23/15 1505    Education provided Yes   Education Details Gradually return to golf and perform strengthening exercises with theraband provided.    Person(s) Educated Patient   Methods Handout;Explanation;Demonstration   Comprehension Verbalized understanding;Returned demonstration             PT Long Term Goals - 09/08/15 1242    PT LONG TERM GOAL #1   Title Patient will report an LEFS score of greater than 50/80 to demonstrate improved tolerance for ADLs.    Baseline 37/80   Time 4   Period Weeks   Status New   PT LONG TERM GOAL #2   Title Patient will demonstrate full AROM in all planes  of lumbar motion with no increase in symptoms for return to ADLs.    Baseline Pain with flexion, extension, R side bend    Time 4   Period Weeks   Status New   PT LONG TERM GOAL #3   Title Patient will return to playing golf with no increase in symptoms to return to recreational activities.    Time 4   Period Weeks   Status New   PT LONG TERM GOAL #4   Title Patient will report worst pain over the last 3 days to be no more than 3/10 to demonstrate improved tolerance for ADLs.    Baseline 8/10   Time 4   Period Weeks   Status New               Plan - 09/23/15 1506    Clinical Impression Statement Patient is reporting a plateau in his progress, and it was determined he may need more loading of the soft tissues in gradual  manner to increase tolerance for recreational activities. He reports feeling significantly looser and after swinging golf club x 1.5 minutes at the end of the session he reported minimal complaints. Patient would benefit from additional skilled PT services to address his mobility deficits.    Rehab Potential Good   Clinical Impairments Affecting Rehab Potential Improvement of 70% already in symptom intensity and frequency    PT Frequency 2x / week   PT Duration 4 weeks   PT Next Visit Plan L hip flexor stretching/L quad stretching, posterior hip/lumbar stretching. Focus on rotational stretching of both hips as well as lumbar spine. Increase rotational strengthening of hips as tolerated.    PT Home Exercise Plan See patient instructions.    Consulted and Agree with Plan of Care Patient      Patient will benefit from skilled therapeutic intervention in order to improve the following deficits and impairments:  Abnormal gait, Pain, Hypomobility  Visit Diagnosis: Lumbago with sciatica, left side  Pain in left hip     Problem List Patient Active Problem List   Diagnosis Date Noted  . Cervical spondylosis without myelopathy 10/24/2014  . 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 Degeneration of cervical intervertebral disc 10/24/2014  . Sebaceous cyst 02/06/2014   Kerman Passey, PT, DPT    09/23/2015, 11:16 PM  [place] [facility] [address], [place], [address] Phone: [phone]   Fax:  [phone]  Name: CASMER LANE MRN: [id number] Date of Birth: Jun 17, 1950

## 2015-09-25 ENCOUNTER — Ambulatory Visit: Payer: 59 | Admitting: Physical Therapy

## 2015-09-25 DIAGNOSIS — M5442 Lumbago with sciatica, left side: Secondary | ICD-10-CM

## 2015-09-25 DIAGNOSIS — M25552 Pain in left hip: Secondary | ICD-10-CM

## 2015-09-25 NOTE — Therapy (Signed)
[facility] [address] [place], [address] Phone: [phone]   Fax:  [phone]  Physical Therapy Treatment  Patient Details  Name: Dennis Barrera MRN: [id number] Date of Birth: 09/02/1950 No Data Recorded  Encounter Date: 09/25/2015      PT End of Session - 09/25/15 1132    Visit Number 5   Number of Visits 9   Date for PT Re-Evaluation 10/13/15   PT Start Time 1031   PT Stop Time 1112   PT Time Calculation (min) 41 min   Activity Tolerance Patient tolerated treatment well   Behavior During Therapy [facility] for tasks assessed/performed      No past medical history on file.  Past Surgical History  Procedure Laterality Date  . Cyst excision  02/05/14    lower back  . Tonsillectomy    . Wisdom tooth extraction    . Shoulder surgery      There were no vitals filed for this visit.      Subjective Assessment - 09/25/15 1033    Subjective Patient was unable to go golfing due to rain. He reports he felt great after last PT session, he did have some increased "tweaks" yesterday which eased off towards the evening and today he does not have any of the hip/hamstring burning sensation which is the first time in several months. He felt "natural" with golf swing this morning.   Pertinent History Patient had a prior episode which felt more "musculaur" which resolved with regular exercise and therapy over the course of several years.    Limitations Lifting;Sitting;Standing;Walking   How long can you stand comfortably? 3-4 hours    Diagnostic tests X-ray of hips - negative, of spine indicative of early arthritis.    Patient Stated Goals To be able to golf again.    Currently in Pain? No/denies     Seated thoracic rotations x5 repetitions of 30" with oscillations at end range bilaterally (reports symptoms of tightness/discomfort initially, which reduced significantly after multiple repetitions)  Educated  patient on self thoracic stretching which he completed 2 bouts x 10 repetitions bilaterally (felt very good and loosened up)  Assessed golf swing noted incomplete follow through (roughly 3/4 turn of back leg) cued patient to "squish the bug" and have laces in line with belly button, belt buckle, nose to ensure full rotation of hips. Patient reports with this he feels much more loose, less restricted.   Chops with 2kg ball, on first one noted all of his rotation came through lumbar rotation and noted a twinge, cued to follow through with hip rotation and noted no pain after this for 2 bouts of 12 repetitions.                            PT Education - 09/25/15 1137    Education provided Yes   Education Details Golf several times over the weekend with video, cuing for increased rotation in hips to complete swing.    Person(s) Educated Patient   Methods Explanation;Demonstration   Comprehension Verbalized understanding;Returned demonstration             PT Long Term Goals - 09/08/15 1242    PT LONG TERM GOAL #1   Title Patient will report an LEFS score of greater than 50/80 to demonstrate improved tolerance for ADLs.    Baseline 37/80   Time 4   Period Weeks  Status New   PT LONG TERM GOAL #2   Title Patient will demonstrate full AROM in all planes of lumbar motion with no increase in symptoms for return to ADLs.    Baseline Pain with flexion, extension, R side bend    Time 4   Period Weeks   Status New   PT LONG TERM GOAL #3   Title Patient will return to playing golf with no increase in symptoms to return to recreational activities.    Time 4   Period Weeks   Status New   PT LONG TERM GOAL #4   Title Patient will report worst pain over the last 3 days to be no more than 3/10 to demonstrate improved tolerance for ADLs.    Baseline 8/10   Time 4   Period Weeks   Status New               Plan - 09/25/15 1132    Clinical Impression Statement  Patient now demonstrating more ROM throughout golf swing and increased rotation with follow through after cuing. He demonstrates one significant tweak while performing chops with weighted ball due to reliance on lumbar rotation vs hip rotation. With cuing he completed 2 sets after this of 12 repetitions with no pain. He is progressing very well towards mobility goals, encouraged to golf this weekend to further assess tolerance for rotary movements.    Rehab Potential Good   Clinical Impairments Affecting Rehab Potential Improvement of 70% already in symptom intensity and frequency    PT Frequency 2x / week   PT Duration 4 weeks   PT Next Visit Plan L hip flexor stretching/L quad stretching, posterior hip/lumbar stretching. Focus on rotational stretching of both hips as well as lumbar spine. Increase rotational strengthening of hips as tolerated.    PT Home Exercise Plan Piriformis stretching, thoracic rotation stretching, Wood chops, single leg hip abductions    Consulted and Agree with Plan of Care Patient      Patient will benefit from skilled therapeutic intervention in order to improve the following deficits and impairments:  Abnormal gait, Pain, Hypomobility  Visit Diagnosis: Lumbago with sciatica, left side  Pain in left hip     Problem List Patient Active Problem List   Diagnosis Date Noted  . Cervical spondylosis without myelopathy 10/24/2014  . 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 Degeneration of cervical intervertebral disc 10/24/2014  . Sebaceous cyst 02/06/2014   Kerman Passey, PT, DPT    09/25/2015, 11:38 AM  [facility] [address] [place], [address] Phone: [phone]   Fax:  [phone]  Name: ABDULAZEEZ MCPHEARSON MRN: [id number] Date of Birth: 1950-09-29

## 2015-09-30 ENCOUNTER — Ambulatory Visit: Payer: 59 | Admitting: Physical Therapy

## 2015-09-30 DIAGNOSIS — M5442 Lumbago with sciatica, left side: Secondary | ICD-10-CM | POA: Diagnosis not present

## 2015-09-30 DIAGNOSIS — M25552 Pain in left hip: Secondary | ICD-10-CM

## 2015-09-30 NOTE — Therapy (Signed)
[facility] [address], [place], [address] Phone: [phone]   Fax:  [phone]  Physical Therapy Treatment  Patient Details  Name: Dennis Barrera MRN: [id number] Date of Birth: Oct 13, 1950 No Data Recorded  Encounter Date: 09/30/2015      PT End of Session - 09/30/15 1429    Visit Number 6   Number of Visits 9   Date for PT Re-Evaluation 10/13/15   PT Start Time 1031   PT Stop Time 1118   PT Time Calculation (min) 47 min   Activity Tolerance Patient tolerated treatment well   Behavior During Therapy [facility] for tasks assessed/performed      No past medical history on file.  Past Surgical History  Procedure Laterality Date  . Cyst excision  02/05/14    lower back  . Tonsillectomy    . Wisdom tooth extraction    . Shoulder surgery      There were no vitals filed for this visit.      Subjective Assessment - 09/30/15 1035    Subjective Patient reports he was able to play 12 holes of golf on Sunday with an Advil before and after with no "tweaks". He had a few tweaks this morning with his exercises, otherwise no real tweaks noted of late.    Pertinent History Patient had a prior episode which felt more "musculaur" which resolved with regular exercise and therapy over the course of several years.    Limitations Lifting;Sitting;Standing;Walking   How long can you stand comfortably? 3-4 hours    Diagnostic tests X-ray of hips - negative, of spine indicative of early arthritis.    Patient Stated Goals To be able to golf again.    Currently in Pain? No/denies        Ely's position stretch had been feeling a twinge in his back while completing with blue band for stretching x 12 for 2 sets cued to decrease strain/stretch provided to feel stretch only at anterior thigh/hip and not in lumbar spine.   Half kneeling hip flexor stretch 2 bouts x 15 repetitions bilaterally   Leg Press 65 #, 85#  (x 10  repetitions, for 3 sets with 85# being more challenging for him). Appropriate LE activation of quads/glutes  HS Curls 25# (medium set for 8 repetitions) to 35# (2 sets x 8 repetitions- fairly challenging).   SLDL (had been feeling some in mid t-spine) provided HHA and cued to keep upper spine in neutral, reduced pain. 2 sets x 8 repetitions bilaterally.   Palloff press with rotation x15, regressed to 10 (felt twinges in lumbar), regressed to 5 (too easy), progressed to 7# bilaterally for 2 sets of 12 repetitions                           PT Education - 09/30/15 1429    Education provided Yes   Education Details Gym exercise program, to go golfing again. Will likely be discharged after next session.    Person(s) Educated Patient   Methods Explanation   Comprehension Verbalized understanding             PT Long Term Goals - 09/08/15 1242    PT LONG TERM GOAL #1   Title Patient will report an LEFS score of greater than 50/80 to demonstrate improved tolerance for ADLs.    Baseline 37/80   Time 4   Period Weeks   Status  New   PT LONG TERM GOAL #2   Title Patient will demonstrate full AROM in all planes of lumbar motion with no increase in symptoms for return to ADLs.    Baseline Pain with flexion, extension, R side bend    Time 4   Period Weeks   Status New   PT LONG TERM GOAL #3   Title Patient will return to playing golf with no increase in symptoms to return to recreational activities.    Time 4   Period Weeks   Status New   PT LONG TERM GOAL #4   Title Patient will report worst pain over the last 3 days to be no more than 3/10 to demonstrate improved tolerance for ADLs.    Baseline 8/10   Time 4   Period Weeks   Status New               Plan - 09/30/15 1430    Clinical Impression Statement Patient was able to golf over the weekend and had no increased "twinges" as a result. He continues to have limited hip extension/knee flexion ROM due to  quadriceps weakness. He has been progressing well with twinges now occurring only during exercises first thing in the morning. He has made great improvements towards established mobility goals, and would benefit from continued maintenance with gym program provided today.    Rehab Potential Good   Clinical Impairments Affecting Rehab Potential Improvement of 70% already in symptom intensity and frequency    PT Frequency 2x / week   PT Duration 4 weeks   PT Next Visit Plan L hip flexor stretching/L quad stretching, posterior hip/lumbar stretching. Focus on rotational stretching of both hips as well as lumbar spine. Increase rotational strengthening of hips as tolerated.    PT Home Exercise Plan Piriformis stretching, thoracic rotation stretching, Wood chops, single leg hip abductions    Consulted and Agree with Plan of Care Patient      Patient will benefit from skilled therapeutic intervention in order to improve the following deficits and impairments:  Abnormal gait, Pain, Hypomobility  Visit Diagnosis: Lumbago with sciatica, left side  Pain in left hip     Problem List Patient Active Problem List   Diagnosis Date Noted  . Cervical spondylosis without myelopathy 10/24/2014  . 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 Degeneration of cervical intervertebral disc 10/24/2014  . Sebaceous cyst 02/06/2014   Kerman Passey, PT, DPT    09/30/2015, 2:33 PM  [facility] [address], [place], [address] Phone: [phone]   Fax:  [phone]  Name: Dennis Barrera MRN: [id number] Date of Birth: 1950/12/23

## 2015-10-02 ENCOUNTER — Encounter: Payer: 59 | Admitting: Physical Therapy

## 2015-10-07 ENCOUNTER — Ambulatory Visit: Payer: 59 | Attending: Family Medicine | Admitting: Physical Therapy

## 2015-10-07 DIAGNOSIS — M5442 Lumbago with sciatica, left side: Secondary | ICD-10-CM | POA: Diagnosis not present

## 2015-10-07 DIAGNOSIS — M25552 Pain in left hip: Secondary | ICD-10-CM | POA: Insufficient documentation

## 2015-10-07 NOTE — Therapy (Signed)
[facility] [address] [place], [address] Phone: [phone]   Fax:  [phone]  Physical Therapy Treatment/Discharge Summary   Patient Details  Name: Dennis Barrera MRN: [id number] Date of Birth: 09-22-1950 No Data Recorded  Encounter Date: 10/07/2015      PT End of Session - 10/07/15 1250    Visit Number 7   Number of Visits 9   Date for PT Re-Evaluation 10/13/15   PT Start Time 1032   PT Stop Time 1113   PT Time Calculation (min) 41 min   Activity Tolerance Patient tolerated treatment well   Behavior During Therapy [facility] for tasks assessed/performed      No past medical history on file.  Past Surgical History  Procedure Laterality Date  . Cyst excision  02/05/14    lower back  . Tonsillectomy    . Wisdom tooth extraction    . Shoulder surgery      There were no vitals filed for this visit.      Subjective Assessment - 10/07/15 1034    Subjective Patient reports he was able to play a full round of golf without any real adverse reactions. He continues to have minor tweaks, however these are reducing in intensity and frequency. He has been diligent with HEP.    Pertinent History Patient had a prior episode which felt more "musculaur" which resolved with regular exercise and therapy over the course of several years.    Limitations Lifting;Sitting;Standing;Walking   How long can you stand comfortably? 3-4 hours    Diagnostic tests X-ray of hips - negative, of spine indicative of early arthritis.    Patient Stated Goals To be able to golf again.    Currently in Pain? No/denies      LEFS - 60/80 Full trunk flexion, extension, rotation, side bending. Only mild pain with extension, however some of this was present at baseline.  Patient and therapist discussed imaging findings of both his hip and lumbar spine (grade 1 anterolisthesis) and how this may or may not correlate to his symptoms. PT  reminded patient to gradually increase activity levels, maintain HEP (stretching program at least 1x per day) and if patient wanted to decrease pain medications to take 1x per day rather than 2x per day, monitor symptoms, and adjust accordingly. Patient advised to continue with playing golf, begin light jogging if he was interested, avoid excessive flexion and rotation in lumbar spine while performing activities. Reviewed findings of Brinkjikji et al 2014, regarding asymptomatic individuals and degenerative changes in the spine.                            PT Education - 10/07/15 1251    Education provided Yes   Education Details Gradually progress exercise program, become more active as tolerated. Decrease pain medication gradually if this is a goal of his.    Person(s) Educated Patient   Methods Explanation   Comprehension Verbalized understanding             PT Long Term Goals - 10/07/15 1053    PT LONG TERM GOAL #1   Title Patient will report an LEFS score of greater than 50/80 to demonstrate improved tolerance for ADLs.    Baseline 37/80, 60/80 on 10/07/2015   Time 4   Period Weeks   Status Achieved   PT LONG TERM GOAL #2   Title Patient will demonstrate  full AROM in all planes of lumbar motion with no increase in symptoms for return to ADLs.    Baseline Pain with flexion, extension, R side bend    Time 4   Period Weeks   Status Achieved   PT LONG TERM GOAL #3   Title Patient will return to playing golf with no increase in symptoms to return to recreational activities.    Time 4   Period Weeks   Status Achieved   PT LONG TERM GOAL #4   Title Patient will report worst pain over the last 3 days to be no more than 3/10 to demonstrate improved tolerance for ADLs.    Baseline 8/10   Time 4   Period Weeks   Status Achieved               Plan - 10/07/15 1247    Clinical Impression Statement Patient was able to play a full 18 holes of golf with  minimal if any increase in symptoms. He occasionally has tweaks in low back/hip, though these are much less frequent and much less intense than they have been previously. He appears to have made an excellent recovery, he reports 97-98% improvement at this time. He appears appropriate for discharge with current exercise plan. Patient was strongly encouraged to gradually increase activity levels.    Rehab Potential Good   Clinical Impairments Affecting Rehab Potential Improvement of 70% already in symptom intensity and frequency    PT Frequency 2x / week   PT Duration 4 weeks   PT Next Visit Plan Discharged    PT Home Exercise Plan Piriformis stretching, thoracic rotation stretching, Wood chops, single leg hip abductions    Consulted and Agree with Plan of Care Patient      Patient will benefit from skilled therapeutic intervention in order to improve the following deficits and impairments:  Abnormal gait, Pain, Hypomobility  Visit Diagnosis: Lumbago with sciatica, left side  Pain in left hip     Problem List Patient Active Problem List   Diagnosis Date Noted  . Cervical spondylosis without myelopathy 10/24/2014  . 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 Degeneration of cervical intervertebral disc 10/24/2014  . Sebaceous cyst 02/06/2014   Kerman Passey, PT, DPT    10/07/2015, 1:08 PM  [facility] [address], [place], [address] Phone: [phone]   Fax:  [phone]  Name: Dennis Barrera MRN: [id number] Date of Birth: November 01, 1950

## 2015-10-14 ENCOUNTER — Encounter: Payer: 59 | Admitting: Physical Therapy

## 2015-10-21 ENCOUNTER — Encounter: Payer: 59 | Admitting: Physical Therapy

## 2015-10-28 ENCOUNTER — Encounter: Payer: 59 | Admitting: Physical Therapy

## 2016-05-31 ENCOUNTER — Encounter: Payer: 59 | Admitting: Family Medicine

## 2016-06-10 ENCOUNTER — Encounter: Payer: Self-pay | Admitting: Family Medicine

## 2016-06-10 ENCOUNTER — Ambulatory Visit (INDEPENDENT_AMBULATORY_CARE_PROVIDER_SITE_OTHER): Payer: 59 | Admitting: Family Medicine

## 2016-06-10 VITALS — BP 104/72 | HR 64 | Temp 98.6°F | Resp 16 | Ht 73.0 in | Wt 206.0 lb

## 2016-06-10 DIAGNOSIS — Z0000 Encounter for general adult medical examination without abnormal findings: Secondary | ICD-10-CM

## 2016-06-10 DIAGNOSIS — Z125 Encounter for screening for malignant neoplasm of prostate: Secondary | ICD-10-CM

## 2016-06-10 DIAGNOSIS — Z1211 Encounter for screening for malignant neoplasm of colon: Secondary | ICD-10-CM | POA: Diagnosis not present

## 2016-06-10 LAB — POCT URINALYSIS DIPSTICK
Bilirubin, UA: NEGATIVE
Blood, UA: NEGATIVE
Glucose, UA: NEGATIVE
KETONES UA: NEGATIVE
LEUKOCYTES UA: NEGATIVE
Nitrite, UA: NEGATIVE
PROTEIN UA: NEGATIVE
Spec Grav, UA: 1.015
Urobilinogen, UA: 0.2
pH, UA: 6

## 2016-06-10 NOTE — Progress Notes (Signed)
Patient: Dennis Barrera, Male    DOB: June 26, 1950, 66 y.o.   MRN: [id number] Visit Date: 06/10/2016  Today's Provider: [name], MD   Chief Complaint  Patient presents with  . Annual Exam   Subjective:  Dennis Barrera is a 66 y.o. male who presents today for health maintenance and complete physical. He feels well. He reports exercising daily for about 30 to 45 minutes light weight training and stretching. He reports he is sleeping well. Patient has worked for [employer] for 37 years. His first wife died suddenly at age 73 and he is happily married for the second time. He has one daughter who has given him a grandson and she is pregnant with her granddaughter. [place] Last colonoscopy 09/19/06 normal repeat in 2018. Tdap 10/25/14. Review of Systems  Constitutional: Negative.   HENT: Negative.   Eyes: Negative.   Respiratory: Negative.   Cardiovascular: Negative.   Gastrointestinal: Negative.   Endocrine: Negative.   Genitourinary: Negative.   Musculoskeletal: Negative.   Skin: Negative.   Allergic/Immunologic: Negative.   Neurological: Negative.   Hematological: Negative.   Psychiatric/Behavioral: Negative.     Social History   Social History  . Marital status: Married    Spouse name: N/A  . Number of children: N/A  . Years of education: N/A   Occupational History  . Not on file.   Social History Main Topics  . Smoking status: Never Smoker  . Smokeless tobacco: Never Used  . Alcohol use Yes     Comment: 1 drink a month  . Drug use: No  . Sexual activity: Not on file   Other Topics Concern  . Not on file   Social History Narrative  . No narrative on file    Patient Active Problem List   Diagnosis Date Noted  . Cervical spondylosis without myelopathy 10/24/2014  . 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 Degeneration of cervical intervertebral disc 10/24/2014  . Sebaceous cyst 02/06/2014    Past Surgical  History:  Procedure Laterality Date  . CYST EXCISION  02/05/14   lower back  . SHOULDER SURGERY    . TONSILLECTOMY    . WISDOM TOOTH EXTRACTION      His family history includes Heart disease in his maternal grandfather; Hypertension in his brother and mother; Osteoarthritis in his father; Stroke in his father.     Outpatient Encounter Prescriptions as of 06/10/2016  Medication Sig  . aspirin 81 MG tablet Take 81 mg by mouth as needed.   . [DISCONTINUED] cyclobenzaprine (FLEXERIL) 10 MG tablet Take 1 tablet (10 mg total) by mouth at bedtime. (Patient not taking: Reported on 08/26/2015)  . [DISCONTINUED] naproxen (NAPROSYN) 500 MG tablet Take 1 tablet (500 mg total) by mouth 2 (two) times daily with a meal.   No facility-administered encounter medications on file as of 06/10/2016.     Patient Care Team: Jerrol Banana., MD as PCP - General (Family Medicine) Jerrol Banana., MD (Family Medicine) Robert Bellow, MD (General Surgery)      Objective:   Vitals:  Vitals:   06/10/16 0920  BP: 104/72  Pulse: 64  Resp: 16  Temp: 98.6 F (37 C)  Weight: 206 lb (93.4 kg)  Height: 6\' 1"  (1.854 m)    Physical Exam  Constitutional: He is oriented to person, place, and time. He appears well-developed and well-nourished.  HENT:  Head: Normocephalic and atraumatic.  Right  Ear: External ear normal.  Left Ear: External ear normal.  Nose: Nose normal.  Mouth/Throat: Oropharynx is clear and moist.  Eyes: Conjunctivae are normal. No scleral icterus.  Neck: No thyromegaly present.  Cardiovascular: Normal rate, regular rhythm and normal heart sounds.   Pulmonary/Chest: Effort normal and breath sounds normal.  Abdominal: Soft.  Musculoskeletal: He exhibits no edema.  Lymphadenopathy:    He has no cervical adenopathy.  Neurological: He is alert and oriented to person, place, and time. No cranial nerve deficit. He exhibits normal muscle tone.  Skin: Skin is warm and dry.   Psychiatric: He has a normal mood and affect. His behavior is normal. Judgment and thought content normal.     Depression Screen PHQ 2/9 Scores 06/10/2016 05/29/2015  PHQ - 2 Score 0 0   Assessment & Plan:   1. Annual physical exam - POCT Urinalysis Dipstick - CBC w/Diff/Platelet - Comprehensive metabolic panel - Lipid Panel With LDL/HDL Ratio - TSH  2. Colon cancer screening - Ambulatory referral to Gastroenterology  3. Prostate cancer screening - PSA DRE deferred to colonoscopy. HPI, Exam and A&P transcribed under direction and in the presence of Miguel Aschoff, MD.  Discussed health benefits of physical activity, and encouraged him to engage in regular exercise appropriate for his age and condition.   I have done the exam and reviewed the chart and it is accurate to the best of my knowledge. Development worker, community has been used and  any errors in dictation or transcription are unintentional. Miguel Aschoff M.D. [facility]

## 2016-06-17 ENCOUNTER — Other Ambulatory Visit: Payer: Self-pay

## 2016-06-22 ENCOUNTER — Telehealth: Payer: Self-pay

## 2016-06-22 NOTE — Telephone Encounter (Signed)
Gastroenterology Pre-Procedure Review  Request Date:  Requesting Physician: Dr.   PATIENT REVIEW QUESTIONS: The patient responded to the following health history questions as indicated:    1. Are you having any GI issues? no 2. Do you have a personal history of Polyps? no 3. Do you have a family history of Colon Cancer or Polyps? no 4. Diabetes Mellitus? no 5. Joint replacements in the past 12 months?no 6. Major health problems in the past 3 months?no 7. Any artificial heart valves, MVP, or defibrillator?no    MEDICATIONS & ALLERGIES:    Patient reports the following regarding taking any anticoagulation/antiplatelet therapy:   Plavix, Coumadin, Eliquis, Xarelto, Lovenox, Pradaxa, Brilinta, or Effient? no Aspirin? no  Patient confirms/reports the following medications:  Current Outpatient Prescriptions  Medication Sig Dispense Refill  . aspirin 81 MG tablet Take 81 mg by mouth as needed.      No current facility-administered medications for this visit.     Patient confirms/reports the following allergies:  No Known Allergies  No orders of the defined types were placed in this encounter.   AUTHORIZATION INFORMATION Primary Insurance: 1D#: Group #:  Secondary Insurance: 1D#: Group #:  SCHEDULE INFORMATION: Date: 07/06/16 Time: Location: [place]

## 2016-06-24 ENCOUNTER — Other Ambulatory Visit: Payer: Self-pay

## 2016-06-24 ENCOUNTER — Telehealth: Payer: Self-pay | Admitting: Gastroenterology

## 2016-06-24 LAB — CBC WITH DIFFERENTIAL/PLATELET
BASOS: 1 %
Basophils Absolute: 0 10*3/uL (ref 0.0–0.2)
EOS (ABSOLUTE): 0.2 10*3/uL (ref 0.0–0.4)
EOS: 3 %
HEMATOCRIT: 45.2 % (ref 37.5–51.0)
Hemoglobin: 15.1 g/dL (ref 13.0–17.7)
Immature Grans (Abs): 0 10*3/uL (ref 0.0–0.1)
Immature Granulocytes: 0 %
LYMPHS ABS: 1.7 10*3/uL (ref 0.7–3.1)
Lymphs: 33 %
MCH: 27.9 pg (ref 26.6–33.0)
MCHC: 33.4 g/dL (ref 31.5–35.7)
MCV: 83 fL (ref 79–97)
MONOS ABS: 0.4 10*3/uL (ref 0.1–0.9)
Monocytes: 7 %
Neutrophils Absolute: 2.8 10*3/uL (ref 1.4–7.0)
Neutrophils: 56 %
Platelets: 353 10*3/uL (ref 150–379)
RBC: 5.42 x10E6/uL (ref 4.14–5.80)
RDW: 13.6 % (ref 12.3–15.4)
WBC: 5.1 10*3/uL (ref 3.4–10.8)

## 2016-06-24 LAB — COMPREHENSIVE METABOLIC PANEL
A/G RATIO: 2 (ref 1.2–2.2)
ALK PHOS: 59 IU/L (ref 39–117)
ALT: 20 IU/L (ref 0–44)
AST: 17 IU/L (ref 0–40)
Albumin: 4.3 g/dL (ref 3.6–4.8)
BILIRUBIN TOTAL: 0.5 mg/dL (ref 0.0–1.2)
BUN/Creatinine Ratio: 19 (ref 10–24)
BUN: 19 mg/dL (ref 8–27)
CHLORIDE: 105 mmol/L (ref 96–106)
CO2: 21 mmol/L (ref 18–29)
Calcium: 9.3 mg/dL (ref 8.6–10.2)
Creatinine, Ser: 0.99 mg/dL (ref 0.76–1.27)
GFR calc Af Amer: 93 (ref >59)
GFR calc non Af Amer: 80 (ref >59)
GLOBULIN, TOTAL: 2.2 (ref 1.5–4.5)
Glucose: 107 mg/dL — ABNORMAL HIGH (ref 65–99)
POTASSIUM: 5 mmol/L (ref 3.5–5.2)
SODIUM: 139 mmol/L (ref 134–144)
Total Protein: 6.5 g/dL (ref 6.0–8.5)

## 2016-06-24 LAB — TSH: TSH: 1.9 u[IU]/mL (ref 0.450–4.500)

## 2016-06-24 LAB — LIPID PANEL WITH LDL/HDL RATIO
CHOLESTEROL TOTAL: 220 mg/dL — AB (ref 100–199)
HDL: 49 mg/dL (ref >39)
LDL Calculated: 153 — ABNORMAL HIGH (ref 0–99)
LDL/HDL RATIO: 3.1 (ref 0.0–3.6)
TRIGLYCERIDES: 92 mg/dL (ref 0–149)
VLDL Cholesterol Cal: 18 (ref 5–40)

## 2016-06-24 LAB — PSA: Prostate Specific Ag, Serum: 1.1 ng/mL (ref 0.0–4.0)

## 2016-06-24 MED ORDER — NA SULFATE-K SULFATE-MG SULF 17.5-3.13-1.6 GM/177ML PO SOLN: 1.0000 | ORAL | 0 refills | Status: DC

## 2016-06-24 NOTE — Telephone Encounter (Signed)
Please call prep into [facility] [address] in [place] and e-mail directions to [email or web address]

## 2016-06-24 NOTE — Progress Notes (Signed)
Advised  ED 

## 2016-06-24 NOTE — Telephone Encounter (Signed)
Prep instructions emailed to pt and rx called into [facility], [place].

## 2016-07-05 ENCOUNTER — Encounter: Payer: Self-pay | Admitting: *Deleted

## 2016-07-06 ENCOUNTER — Ambulatory Visit: Payer: 59 | Admitting: Anesthesiology

## 2016-07-06 ENCOUNTER — Ambulatory Visit
Admission: RE | Admit: 2016-07-06 | Discharge: 2016-07-06 | Disposition: A | Discharge status: Home / Self Care | Payer: 59 | Source: Ambulatory Visit | Attending: Gastroenterology | Admitting: Gastroenterology

## 2016-07-06 ENCOUNTER — Encounter
Admission: RE | Disposition: A | Discharge status: Home / Self Care | Payer: Self-pay | Source: Ambulatory Visit | Attending: Gastroenterology

## 2016-07-06 DIAGNOSIS — K648 Other hemorrhoids: Secondary | ICD-10-CM | POA: Diagnosis not present

## 2016-07-06 DIAGNOSIS — Z7982 Long term (current) use of aspirin: Secondary | ICD-10-CM | POA: Insufficient documentation

## 2016-07-06 DIAGNOSIS — M1990 Unspecified osteoarthritis, unspecified site: Secondary | ICD-10-CM | POA: Diagnosis not present

## 2016-07-06 DIAGNOSIS — D123 Benign neoplasm of transverse colon: Secondary | ICD-10-CM | POA: Diagnosis not present

## 2016-07-06 DIAGNOSIS — Z1211 Encounter for screening for malignant neoplasm of colon: Secondary | ICD-10-CM | POA: Insufficient documentation

## 2016-07-06 HISTORY — PX: COLONOSCOPY WITH PROPOFOL: SHX5780

## 2016-07-06 SURGERY — COLONOSCOPY WITH PROPOFOL
Anesthesia: General

## 2016-07-06 MED ORDER — PROPOFOL 500 MG/50ML IV EMUL
INTRAVENOUS | Status: DC | PRN
  Administered 2016-07-06: 180 ug/kg/min via INTRAVENOUS

## 2016-07-06 MED ORDER — EPHEDRINE SULFATE 50 MG/ML IJ SOLN
INTRAMUSCULAR | Status: DC | PRN
  Administered 2016-07-06: 10 mg via INTRAVENOUS

## 2016-07-06 MED ORDER — PROPOFOL 500 MG/50ML IV EMUL
INTRAVENOUS | Status: AC
  Filled 2016-07-06: qty 50

## 2016-07-06 MED ORDER — PROPOFOL 10 MG/ML IV BOLUS
INTRAVENOUS | Status: DC | PRN
  Administered 2016-07-06: 100 mg via INTRAVENOUS

## 2016-07-06 MED ORDER — SODIUM CHLORIDE 0.9 % IV SOLN
INTRAVENOUS | Status: DC
  Administered 2016-07-06 (×2): via INTRAVENOUS

## 2016-07-06 NOTE — H&P (Signed)
  [name] MD [address], [place], [place] [address] Phone: [phone] Fax : [phone]  Primary Care Physician:  [name], MD Primary Gastroenterologist:  Dr. [name]   Pre-Procedure History & Physical: HPI:  Dennis Barrera is a 66 y.o. male is here for an colonoscopy.   History reviewed. No pertinent past medical history.  Past Surgical History:  Procedure Laterality Date  . CYST EXCISION  02/05/14   lower back  . SHOULDER SURGERY    . TONSILLECTOMY    . WISDOM TOOTH EXTRACTION      Prior to Admission medications   Medication Sig Start Date End Date Taking? Authorizing Provider  aspirin 81 MG tablet Take 81 mg by mouth as needed.     Historical Provider, MD  Na Sulfate-K Sulfate-Mg Sulf (SUPREP BOWEL PREP KIT) 17.5-3.13-1.6 GM/180ML SOLN Take 1 kit by mouth as directed. 06/24/16   [name], MD    Allergies as of 06/17/2016  . (No Known Allergies)    Family History  Problem Relation Age of Onset  . Hypertension Mother   . Stroke Father   . Osteoarthritis Father   . Hypertension Brother   . Heart disease Maternal Grandfather     Social History   Social History  . Marital status: Married    Spouse name: N/A  . Number of children: N/A  . Years of education: N/A   Occupational History  . Not on file.   Social History Main Topics  . Smoking status: Never Smoker  . Smokeless tobacco: Never Used  . Alcohol use Yes     Comment: 1 drink a month  . Drug use: No  . Sexual activity: Not on file   Other Topics Concern  . Not on file   Social History Narrative  . No narrative on file    Review of Systems: See HPI, otherwise negative ROS  Physical Exam: There were no vitals taken for this visit. General:   Alert,  pleasant and cooperative in NAD Head:  Normocephalic and atraumatic. Neck:  Supple; no masses or thyromegaly. Lungs:  Clear throughout to auscultation.    Heart:  Regular rate and rhythm. Abdomen:  Soft,  nontender and nondistended. Normal bowel sounds, without guarding, and without rebound.   Neurologic:  Alert and  oriented x4;  grossly normal neurologically.  Impression/Plan: Dennis Barrera is here for an colonoscopy to be performed for Screening colonoscopy average risk    Risks, benefits, limitations, and alternatives regarding  colonoscopy have been reviewed with the patient.  Questions have been answered.  All parties agreeable.   [name], MD  07/06/2016, 8:54 AM

## 2016-07-06 NOTE — Anesthesia Procedure Notes (Signed)
Date/Time: 07/06/2016 9:09 AM Performed by: Nelda Marseille Pre-anesthesia Checklist: Patient identified, Emergency Drugs available, Suction available, Patient being monitored and Timeout performed Oxygen Delivery Method: Nasal cannula

## 2016-07-06 NOTE — Anesthesia Postprocedure Evaluation (Signed)
Anesthesia Post Note  Patient: Dennis Barrera  Procedure(s) Performed: Procedure(s) (LRB): COLONOSCOPY WITH PROPOFOL (N/A)  Patient location during evaluation: Endoscopy Anesthesia Type: General Level of consciousness: awake and alert and oriented Pain management: pain level controlled Vital Signs Assessment: post-procedure vital signs reviewed and stable Respiratory status: spontaneous breathing, nonlabored ventilation and respiratory function stable Cardiovascular status: blood pressure returned to baseline and stable Postop Assessment: no signs of nausea or vomiting Anesthetic complications: no     Last Vitals:  Vitals:   07/06/16 0930 07/06/16 0940  BP: (!) 91/45 110/75  Pulse: 75 87  Resp: (!) 3 16  Temp:      Last Pain:  Vitals:   07/06/16 0920  TempSrc: Tympanic                 Talya Quain

## 2016-07-06 NOTE — Anesthesia Post-op Follow-up Note (Cosign Needed)
Anesthesia QCDR form completed.        

## 2016-07-06 NOTE — Anesthesia Preprocedure Evaluation (Signed)
Anesthesia Evaluation  Patient identified by MRN, date of birth, ID band Patient awake    Reviewed: Allergy & Precautions, NPO status , Patient's Chart, lab work & pertinent test results  History of Anesthesia Complications Negative for: history of anesthetic complications  Airway Mallampati: II  TM Distance: >3 FB Neck ROM: Full    Dental  (+) Implants   Pulmonary neg pulmonary ROS, neg sleep apnea, neg COPD,    breath sounds clear to auscultation- rhonchi (-) wheezing      Cardiovascular Exercise Tolerance: Good (-) hypertension(-) CAD and (-) Past MI  Rhythm:Regular Rate:Normal - Systolic murmurs and - Diastolic murmurs    Neuro/Psych negative neurological ROS  negative psych ROS   GI/Hepatic negative GI ROS, Neg liver ROS,   Endo/Other  negative endocrine ROSneg diabetes  Renal/GU negative Renal ROS     Musculoskeletal  (+) Arthritis ,   Abdominal (+) - obese,   Peds  Hematology negative hematology ROS (+)   Anesthesia Other Findings   Reproductive/Obstetrics                             Anesthesia Physical Anesthesia Plan  ASA: II  Anesthesia Plan: General   Post-op Pain Management:    Induction: Intravenous  Airway Management Planned: Natural Airway  Additional Equipment:   Intra-op Plan:   Post-operative Plan:   Informed Consent: I have reviewed the patients History and Physical, chart, labs and discussed the procedure including the risks, benefits and alternatives for the proposed anesthesia with the patient or authorized representative who has indicated his/her understanding and acceptance.   Dental advisory given  Plan Discussed with: CRNA and Anesthesiologist  Anesthesia Plan Comments:         Anesthesia Quick Evaluation

## 2016-07-06 NOTE — Op Note (Signed)
[facility] Gastroenterology Patient Name: Dennis Barrera Procedure Date: 07/06/2016 8:59 AM MRN: [id number] Account #: [id number] Date of Birth: Apr 26, 1951 Admit Type: Outpatient Age: 66 Room: [facility] ENDO ROOM 3 Gender: Male Note Status: Finalized Procedure:            Colonoscopy Indications:          Screening for colorectal malignant neoplasm Providers:            [name] MD, MD Referring MD:         Janine Ores. Rosanna Randy, MD (Referring MD) Medicines:            Monitored Anesthesia Care Complications:        No immediate complications. Procedure:            Pre-Anesthesia Assessment:                       - Prior to the procedure, a History and Physical was                        performed, and patient medications, allergies and                        sensitivities were reviewed. The patient's tolerance of                        previous anesthesia was reviewed.                       - The risks and benefits of the procedure and the                        sedation options and risks were discussed with the                        patient. All questions were answered and informed                        consent was obtained.                       - ASA Grade Assessment: II - A patient with mild                        systemic disease.                       After obtaining informed consent, the colonoscope was                        passed under direct vision. Throughout the procedure,                        the patient's blood pressure, pulse, and oxygen                        saturations were monitored continuously. The                        Colonoscope was introduced through the anus and  advanced to the the cecum, identified by the                        appendiceal orifice, IC valve and transillumination.                        The colonoscopy was performed with ease. The patient                        tolerated the procedure well. The  quality of the bowel                        preparation was excellent. Findings:      The perianal and digital rectal examinations were normal.      A 5 mm polyp was found in the transverse colon. The polyp was sessile.       The polyp was removed with a cold biopsy forceps. Resection and       retrieval were complete.      Non-bleeding internal hemorrhoids were found during retroflexion. The       hemorrhoids were medium-sized and Grade I (internal hemorrhoids that do       not prolapse).      The exam was otherwise without abnormality on direct and retroflexion       views. Impression:           - One 5 mm polyp in the transverse colon, removed with                        a cold biopsy forceps. Resected and retrieved.                       - Non-bleeding internal hemorrhoids.                       - The examination was otherwise normal on direct and                        retroflexion views. Recommendation:       - Discharge patient to home (with escort).                       - Resume previous diet.                       - Continue present medications.                       - Await pathology results.                       - Repeat colonoscopy in 5-10 years for surveillance                        based on pathology results. Procedure Code(s):    --- Professional ---                       [phone], Colonoscopy, flexible; with biopsy, single or                        multiple Diagnosis Code(s):    --- Professional ---  Z12.11, Encounter for screening for malignant neoplasm                        of colon                       D12.3, Benign neoplasm of transverse colon (hepatic                        flexure or splenic flexure)                       K64.0, First degree hemorrhoids CPT copyright 2016 American Medical Association. All rights reserved. The codes documented in this report are preliminary and upon coder review may  be revised to meet current compliance  requirements. [name], MD [name] MD, MD 07/06/2016 9:24:12 AM This report has been signed electronically. Number of Addenda: 0 Note Initiated On: 07/06/2016 8:59 AM Scope Withdrawal Time: 0 hours 12 minutes 26 seconds  Total Procedure Duration: 0 hours 15 minutes 37 seconds       [facility]

## 2016-07-06 NOTE — Transfer of Care (Signed)
Immediate Anesthesia Transfer of Care Note  Patient: Dennis Barrera  Procedure(s) Performed: Procedure(s): COLONOSCOPY WITH PROPOFOL (N/A)  Patient Location: PACU  Anesthesia Type:General  Level of Consciousness: sedated  Airway & Oxygen Therapy: Patient Spontanous Breathing and Patient connected to nasal cannula oxygen  Post-op Assessment: Report given to RN and Post -op Vital signs reviewed and stable  Post vital signs: Reviewed and stable  Last Vitals:  Vitals:   07/06/16 0920  BP: (!) (P) 81/45  Pulse: (P) 72  Resp: (P) 20  Temp: (!) (P) 35.8 C    Last Pain:  Vitals:   07/06/16 0920  TempSrc: (P) Tympanic         Complications: No apparent anesthesia complications

## 2016-07-07 ENCOUNTER — Encounter: Payer: Self-pay | Admitting: Gastroenterology

## 2016-07-08 ENCOUNTER — Encounter: Payer: Self-pay | Admitting: Gastroenterology

## 2016-07-08 LAB — SURGICAL PATHOLOGY

## 2016-08-09 ENCOUNTER — Ambulatory Visit (INDEPENDENT_AMBULATORY_CARE_PROVIDER_SITE_OTHER): Payer: 59 | Admitting: Family Medicine

## 2016-08-09 ENCOUNTER — Encounter: Payer: Self-pay | Admitting: Family Medicine

## 2016-08-09 VITALS — BP 128/74 | HR 74 | Temp 98.2°F | Resp 16 | Wt 204.0 lb

## 2016-08-09 DIAGNOSIS — N529 Male erectile dysfunction, unspecified: Secondary | ICD-10-CM

## 2016-08-09 MED ORDER — SILDENAFIL CITRATE 20 MG PO TABS: 20.0000 mg | ORAL_TABLET | Freq: Every day | ORAL | 11 refills | Status: DC | PRN

## 2016-08-09 NOTE — Progress Notes (Signed)
Subjective:  HPI Pt is here today for ED. He was seen last year for CPE and ED was discussed but the prescription was too expensive and he said it was not as bad. But now it is worse and he would like to try something that may be cheaper or generic. Looks like Viagra was sent in last year in March.   Prior to Admission medications   Medication Sig Start Date End Date Taking? Authorizing Provider  aspirin 81 MG tablet Take 81 mg by mouth as needed.     Historical Provider, MD  Na Sulfate-K Sulfate-Mg Sulf (SUPREP BOWEL PREP KIT) 17.5-3.13-1.6 GM/180ML SOLN Take 1 kit by mouth as directed. 06/24/16   [name], MD    Patient Active Problem List   Diagnosis Date Noted  . Cervical spondylosis without myelopathy 10/24/2014  . 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 Degeneration of cervical intervertebral disc 10/24/2014  . Sebaceous cyst 02/06/2014    History reviewed. No pertinent past medical history.  Social History   Social History  . Marital status: Married    Spouse name: N/A  . Number of children: N/A  . Years of education: N/A   Occupational History  . Not on file.   Social History Main Topics  . Smoking status: Never Smoker  . Smokeless tobacco: Never Used  . Alcohol use Yes     Comment: 1 drink a month  . Drug use: No  . Sexual activity: Not on file   Other Topics Concern  . Not on file   Social History Narrative  . No narrative on file    Not on File  Review of Systems  Constitutional: Negative.   HENT: Negative.   Eyes: Negative.   Respiratory: Negative.   Cardiovascular: Negative.   Gastrointestinal: Negative.   Genitourinary: Negative.        ED  Musculoskeletal: Negative.   Skin: Negative.   Neurological: Negative.   Endo/Heme/Allergies: Negative.   Psychiatric/Behavioral: Negative.     Immunization History  Administered Date(s) Administered  . Tdap 10/25/2014    Objective:  BP 128/74 (BP Location: Left Arm, Patient Position:  Sitting, Cuff Size: Normal)   Pulse 74   Temp 98.2 F (36.8 C) (Oral)   Resp 16   Wt 204 lb (92.5 kg)   BMI 26.91 kg/m   Physical Exam  Constitutional: He is oriented to person, place, and time and well-developed, well-nourished, and in no distress.  Eyes: Conjunctivae and EOM are normal. Pupils are equal, round, and reactive to light.  Neck: Normal range of motion. Neck supple.  Cardiovascular: Normal rate, regular rhythm, normal heart sounds and intact distal pulses.   Pulmonary/Chest: Effort normal and breath sounds normal.  Neurological: He is alert and oriented to person, place, and time. He has normal reflexes. Gait normal. GCS score is 15.  Skin: Skin is warm and dry.  Psychiatric: Mood, memory, affect and judgment normal.    Lab Results  Component Value Date   WBC 5.1 06/23/2016   HCT 45.2 06/23/2016   PLT 353 06/23/2016   GLUCOSE 107 (H) 06/23/2016   CHOL 220 (H) 06/23/2016   TRIG 92 06/23/2016   HDL 49 06/23/2016   LDLCALC 153 (H) 06/23/2016   TSH 1.900 06/23/2016    CMP     Component Value Date/Time   NA 139 06/23/2016 0816   K 5.0 06/23/2016 0816   CL 105 06/23/2016 0816   CO2 21 06/23/2016 0816   GLUCOSE  107 (H) 06/23/2016 0816   BUN 19 06/23/2016 0816   CREATININE 0.99 06/23/2016 0816   CALCIUM 9.3 06/23/2016 0816   PROT 6.5 06/23/2016 0816   ALBUMIN 4.3 06/23/2016 0816   AST 17 06/23/2016 0816   ALT 20 06/23/2016 0816   ALKPHOS 59 06/23/2016 0816   BILITOT 0.5 06/23/2016 0816   GFRNONAA 80 06/23/2016 0816   GFRAA 93 06/23/2016 0816    Assessment and Plan :  1. Erectile dysfunction, unspecified erectile dysfunction type  - sildenafil (REVATIO) 20 MG tablet; Take 1 tablet (20 mg total) by mouth daily as needed.  Dispense: 25 tablet; Refill: 11   HPI, Exam, and A&P Transcribed under the direction and in the presence of Tiffony Kite L. Cranford Mon, MD  Electronically Signed: Katina Dung, [place] MD [facility] 08/09/2016 9:41 AM

## 2017-09-05 ENCOUNTER — Ambulatory Visit (INDEPENDENT_AMBULATORY_CARE_PROVIDER_SITE_OTHER): Payer: 59 | Admitting: Family Medicine

## 2017-09-05 ENCOUNTER — Encounter: Payer: Self-pay | Admitting: Family Medicine

## 2017-09-05 VITALS — BP 124/76 | HR 66 | Temp 98.4°F | Resp 14 | Wt 204.0 lb

## 2017-09-05 DIAGNOSIS — M5030 Other cervical disc degeneration, unspecified cervical region: Secondary | ICD-10-CM

## 2017-09-05 DIAGNOSIS — G8929 Other chronic pain: Secondary | ICD-10-CM | POA: Diagnosis not present

## 2017-09-05 DIAGNOSIS — M5442 Lumbago with sciatica, left side: Secondary | ICD-10-CM

## 2017-09-05 DIAGNOSIS — M5441 Lumbago with sciatica, right side: Secondary | ICD-10-CM | POA: Diagnosis not present

## 2017-09-05 MED ORDER — CYCLOBENZAPRINE HCL 10 MG PO TABS: 10.0000 mg | ORAL_TABLET | Freq: Every day | ORAL | 3 refills | Status: DC

## 2017-09-05 MED ORDER — NAPROXEN 500 MG PO TBEC: 500.0000 mg | DELAYED_RELEASE_TABLET | Freq: Two times a day (BID) | ORAL | 3 refills | Status: DC

## 2017-09-05 NOTE — Progress Notes (Signed)
Patient: Dennis Barrera Male    DOB: [birth year]   67 y.o.   MRN: [id number] Visit Date: 09/05/2017  Today's Provider: [name], MD   Chief Complaint  Patient presents with  . Back Pain   Subjective:    HPI Pt is here for lower back pain. He reports that he has the pain about this time every year. He has been doing yard [employer]. Last time when he had this he was put on medication and sent to PT and the PT worked better than the medication did. He would like a referral back to PT. Pt reports that this has been going on for about 3 week now. He reports that it feels like a short circuit going down his hips and legs. He would like to see what could be causing this issue.      Not on File   Current Outpatient Medications:  .  aspirin 81 MG tablet, Take 81 mg by mouth as needed. , Disp: , Rfl:  .  sildenafil (REVATIO) 20 MG tablet, Take 1 tablet (20 mg total) by mouth daily as needed., Disp: 25 tablet, Rfl: 11  Review of Systems  Constitutional: Negative.   HENT: Negative.   Eyes: Negative.   Respiratory: Negative.   Cardiovascular: Negative.   Gastrointestinal: Negative.   Endocrine: Negative.   Genitourinary: Negative.   Musculoskeletal: Positive for back pain.  Skin: Negative.   Allergic/Immunologic: Negative.   Neurological: Positive for weakness.  Psychiatric/Behavioral: Negative.     Social History   Tobacco Use  . Smoking status: Never Smoker  . Smokeless tobacco: Never Used  Substance Use Topics  . Alcohol use: Yes    Comment: 1 drink a month   Objective:   BP 124/76 (BP Location: Left Arm, Patient Position: Sitting, Cuff Size: Normal)   Pulse 66   Temp 98.4 F (36.9 C) (Oral)   Resp 14   Wt 204 lb (92.5 kg)   BMI 26.91 kg/m  Vitals:   09/05/17 1528  BP: 124/76  Pulse: 66  Resp: 14  Temp: 98.4 F (36.9 C)  TempSrc: Oral  Weight: 204 lb (92.5 kg)     Physical Exam  Constitutional: He is oriented to person, place, and time.  He appears well-developed and well-nourished.  HENT:  Head: Normocephalic and atraumatic.  Eyes: Conjunctivae are normal. No scleral icterus.  Neck: No thyromegaly present.  Cardiovascular: Normal rate, regular rhythm and normal heart sounds.  Pulmonary/Chest: Effort normal and breath sounds normal.  Abdominal: Soft.  Neurological: He is alert and oriented to person, place, and time. He displays normal reflexes. He exhibits normal muscle tone. Coordination normal.  Skin: Skin is warm and dry.  Psychiatric: He has a normal mood and affect. His behavior is normal. Judgment and thought content normal.        Assessment & Plan:     1. Degeneration of cervical intervertebral disc   2. Chronic bilateral low back pain with bilateral sciatica  - Ambulatory referral to Physical Therapy - cyclobenzaprine (FLEXERIL) 10 MG tablet; Take 1 tablet (10 mg total) by mouth at bedtime.  Dispense: 30 tablet; Refill: 3 - naproxen (EC NAPROSYN) 500 MG EC tablet; Take 1 tablet (500 mg total) by mouth 2 (two) times daily with a meal.  Dispense: 60 tablet; Refill: 3      I have done the exam and reviewed the above chart and it is accurate to the best of my knowledge.  Development worker, community has been used in this note in any air is in the dictation or transcription are unintentional.  [name], MD  [place]

## 2017-09-22 ENCOUNTER — Ambulatory Visit: Payer: 59 | Attending: Family Medicine

## 2017-09-22 DIAGNOSIS — M545 Low back pain, unspecified: Secondary | ICD-10-CM

## 2017-09-22 DIAGNOSIS — M62838 Other muscle spasm: Secondary | ICD-10-CM | POA: Diagnosis present

## 2017-09-22 NOTE — Therapy (Signed)
[place] [facility] [address], [place], [address] Phone: [phone]   Fax:  [phone]  Physical Therapy Evaluation  Patient Details  Name: Dennis Barrera MRN: [id number] Date of Birth: Nov 18, 1950 Referring Provider: Rosanna Randy MD   Encounter Date: 09/22/2017  PT End of Session - 09/22/17 1053    Visit Number  1    Number of Visits  13    Date for PT Re-Evaluation  11/03/17    Authorization Type  1/10 Progress note    PT Start Time  0930    PT Stop Time  1030    PT Time Calculation (min)  60 min    Activity Tolerance  Patient tolerated treatment well    Behavior During Therapy  [facility] for tasks assessed/performed       History reviewed. No pertinent past medical history.  Past Surgical History:  Procedure Laterality Date  . COLONOSCOPY WITH PROPOFOL N/A 07/06/2016   Procedure: COLONOSCOPY WITH PROPOFOL;  Surgeon: Dennis Bellows, MD;  Location: [facility] ENDOSCOPY;  Service: Endoscopy;  Laterality: N/A;  . CYST EXCISION  02/05/14   lower back  . SHOULDER SURGERY    . TONSILLECTOMY    . WISDOM TOOTH EXTRACTION      There were no vitals filed for this visit.   Subjective Assessment - 09/22/17 1045    Subjective  Patient states increased low back pain which started around a month prior after perfoming weedeating, hedge cutting, and having to mow the lawn. Patient reports the onset of his pain started after rotating to bend soemthing down off the ground. Patient reports he had this pain before, but it resolved after coming to physical therapy. Patient reports his pain is greatly improving since performing the exercises the previous therapist gave him his last time going through therapy. Patient reports the pain is worse with bending and rotating in sitting as well as bending over to pick something off the ground. Patient reports he has been taking muscle relaxers which has intiially helped his pain but did not completely  take it away.  Patient reports he would like to return to playing golf. Patient reports pain is worst in the morning.     Pertinent History  Patient had a prior episode which felt more "musculaur" which resolved with regular exercise and therapy over the course of several years.     Limitations  Lifting;Sitting;Standing;Walking    Diagnostic tests  X-ray of hips - negative, of spine indicative of early arthritis.     Patient Stated Goals  To be able to golf again.     Currently in Pain?  Yes    Pain Score  3  worst: 4/10; best 0/10    Pain Location  Back    Pain Orientation  Lower;Left;Right    Pain Descriptors / Indicators  Aching    Pain Type  Chronic pain    Pain Onset  More than a month ago    Pain Frequency  Intermittent         [facility] PT Assessment - 09/22/17 1051      Assessment   Medical Diagnosis  Low back pain    Referring Provider  Rosanna Randy MD    Onset Date/Surgical Date  08/01/17    Next MD Visit  unknown    Prior Therapy  yes for LBP      Restrictions   Weight Bearing Restrictions  No      Balance Screen  Has the patient fallen in the past 6 months  No    Has the patient had a decrease in activity level because of a fear of falling?   Yes    Is the patient reluctant to leave their home because of a fear of falling?   No      Prior Function   Level of Independence  Independent    Vocation  Full time employment    Vocation Requirements  Sitting, computer work     Leisure  Agricultural consultant   Overall Cognitive Status  Within Functional Limits for tasks assessed      Observation/Other Assessments   Focus on Therapeutic Outcomes (FOTO)   52      Sensation   Light Touch  Appears Intact      Functional Tests   Functional tests  Other;Squat      Squat   Comments  Good technique - no excessive knee movement      Other:   Other/ Comments  Golf swing - Good technique; no increase in pain with performance      Posture/Postural Control   Posture Comments   Slight decrease in lumbar lordosis in sitting       ROM / Strength   AROM / PROM / Strength  AROM;Strength      AROM   AROM Assessment Site  Lumbar;Hip    Right/Left Hip  Right;Left    Right Hip Extension  15    Right Hip Flexion  120    Right Hip External Rotation   50    Right Hip Internal Rotation   30    Right Hip ABduction  40    Right Hip ADduction  20    Left Hip Extension  15    Left Hip Flexion  120    Left Hip External Rotation   50    Left Hip Internal Rotation   30    Left Hip ABduction  40    Left Hip ADduction  20    Lumbar Flexion  WNL Increased pain after performance    Lumbar Extension  25% limited No increase in pain     Lumbar - Right Side Bend  WNL    Lumbar - Left Side Bend  WNL    Lumbar - Right Rotation  25% limited  slight increase in pain on the L side     Lumbar - Left Rotation  25% limited  Slight increase in pain on the R side      Strength   Strength Assessment Site  Hip;Ankle    Right/Left Hip  Right;Left    Right Hip Flexion  5/5    Right Hip Extension  4/5    Right Hip External Rotation   4/5    Right Hip Internal Rotation  4+/5    Right Hip ABduction  4/5    Right Hip ADduction  4+/5    Left Hip Flexion  5/5    Left Hip Extension  4/5    Left Hip External Rotation  4/5    Left Hip Internal Rotation  4+/5    Left Hip ABduction  4/5    Left Hip ADduction  4/5    Right/Left Ankle  Right;Left    Right Ankle Dorsiflexion  4/5    Left Ankle Dorsiflexion  5/5      Palpation   Spinal mobility  hypomobility along lower lumbar L3-5; hypomobility and pain along sacral  mobs grade IV    SI assessment   negative for all tests    Palpation comment  TTP: along mulitifidus B      FABER test   findings  Negative      Straight Leg Raise   Findings  Negative      Ambulation/Gait   Gait Comments  No major differences between LEs       Objective measurements completed on examination: See above findings.    TREATMENT Therapeutic  Exercise: Golf swings performed in standing -- x 20  Seated multifidus crunch -- x20  CKC standing lumbar and hip extension with arms supported against raised surface -- x 20   Patient demonstrates decreased pain at the end of the session    PT Education - 09/22/17 1052    Education provided  Yes    Education Details  HEP: seated mulitifidus crunch, standing CKC lumbar extension    Person(s) Educated  Patient    Methods  Explanation;Demonstration;Handout    Comprehension  Returned demonstration;Verbalized understanding          PT Long Term Goals - 09/22/17 1054      PT LONG TERM GOAL #1   Title  Patient will be independent with HEP to continue benefits of therapy after discharge.     Baseline  Moderate cueing to exercise performance and progression    Time  6    Period  Weeks    Status  New    Target Date  11/03/17      PT LONG TERM GOAL #2   Title  Patient will be able to play a full round of golf without increase in pain to improve ability to perform recreational activities.     Baseline  Unable to play golf without increase in pain    Time  6    Period  Weeks    Status  New    Target Date  11/03/17      PT LONG TERM GOAL #3   Title  Patient will have a worst pain of 1/10 to indicate functional improvement with goals and return to ADLs without pain    Baseline  4/10 worst pain    Time  6    Period  Weeks    Status  New    Target Date  11/03/17             Plan - 09/22/17 1057    Clinical Impression Statement  Patient is a 67 yo male presenting with increased LBP after performing greater amount of physical activity. Patient demonstrates increased lumbar dysfunction with possible involvement of the multifidus musculature and hypomobility of the lumbar spine. Patient also demonstrates decrease in strength and coodination with activities and will benefit from further skilled therapy to return to prior level of function without increase in pain.     History and  Personal Factors relevant to plan of care:  history of LBP    Clinical Presentation  Stable    Clinical Presentation due to:  Improvement in symtpoms    Clinical Decision Making  Moderate    Rehab Potential  Good    Clinical Impairments Affecting Rehab Potential  Improvement of 70% already in symptom intensity and frequency     PT Frequency  2x / week    PT Duration  6 weeks    PT Treatment/Interventions  Balance training;Electrical Stimulation;Iontophoresis 4mg /ml Dexamethasone;Cryotherapy;Therapeutic activities;Neuromuscular re-education;Stair training;Gait training;Moist Heat;Manual techniques;Therapeutic exercise;Passive range of motion;Dry needling    PT  Next Visit Plan  Progress strengthening and decreasing pain    PT Home Exercise Plan  See education section    Consulted and Agree with Plan of Care  Patient       Patient will benefit from skilled therapeutic intervention in order to improve the following deficits and impairments:  Abnormal gait, Pain, Hypomobility, Difficulty walking, Decreased activity tolerance, Decreased range of motion, Decreased strength  Visit Diagnosis: Acute bilateral low back pain without sciatica - Plan: PT plan of care cert/re-cert  Other muscle spasm - Plan: PT plan of care cert/re-cert     Problem List Patient Active Problem List   Diagnosis Date Noted  . Cervical spondylosis without myelopathy 10/24/2014  . 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 Degeneration of cervical intervertebral disc 10/24/2014  . Sebaceous cyst 02/06/2014    Blythe Stanford, PT DPT 09/22/2017, 11:10 AM  [place] [facility] [address] [place], [address] Phone: [phone]   Fax:  [phone]  Name: Dennis Barrera MRN: [id number] Date of Birth: 09-28-1950

## 2017-09-29 ENCOUNTER — Ambulatory Visit: Payer: 59

## 2017-10-04 ENCOUNTER — Ambulatory Visit: Payer: 59 | Attending: Family Medicine

## 2017-10-04 DIAGNOSIS — M545 Low back pain, unspecified: Secondary | ICD-10-CM

## 2017-10-04 DIAGNOSIS — M62838 Other muscle spasm: Secondary | ICD-10-CM | POA: Diagnosis present

## 2017-10-04 NOTE — Therapy (Signed)
[place] [facility] [address], [place], [address] Phone: [phone]   Fax:  [phone]  Physical Therapy Treatment  Patient Details  Name: Dennis Barrera MRN: [id number] Date of Birth: [birth year] Referring Provider: Rosanna Randy MD   Encounter Date: 10/04/2017  PT End of Session - 10/04/17 0938    Visit Number  2    Number of Visits  13    Date for PT Re-Evaluation  11/03/17    Authorization Type  2/10 Progress note    PT Start Time  0900    PT Stop Time  0945    PT Time Calculation (min)  45 min    Activity Tolerance  Patient tolerated treatment well    Behavior During Therapy  [facility] for tasks assessed/performed       History reviewed. No pertinent past medical history.  Past Surgical History:  Procedure Laterality Date  . COLONOSCOPY WITH PROPOFOL N/A 07/06/2016   Procedure: COLONOSCOPY WITH PROPOFOL;  Surgeon: [name], MD;  Location: [facility] ENDOSCOPY;  Service: Endoscopy;  Laterality: N/A;  . CYST EXCISION  02/05/14   lower back  . SHOULDER SURGERY    . TONSILLECTOMY    . WISDOM TOOTH EXTRACTION      There were no vitals filed for this visit.  Subjective Assessment - 10/04/17 0909    Subjective  Patient reports he was able to perform golf and digging a hole for a post this weekend. Patient states he continues to get a pain 'every once in a while'    Pertinent History  Patient had a prior episode which felt more "musculaur" which resolved with regular exercise and therapy over the course of several years.     Limitations  Lifting;Sitting;Standing;Walking    Diagnostic tests  X-ray of hips - negative, of spine indicative of early arthritis.     Patient Stated Goals  To be able to golf again.     Currently in Pain?  No/denies    Pain Onset  More than a month ago       TREATMENT Therapeutic Exercise: Hip extension in prone - 2 x 10  Prone press ups - 2 x 10  Pilloff Press at [facility] - x 20 with  15# Squats with press offs with 4# ball - x 20  Overhead lumbar extension with 4# ball 2 x 10  Hip abduction at Hip machine - x 20 40# Manual Therapy: L4-S1 in prone mobilizations to decrease pain and spasms as well improve segmental mobility along the affected segments 2 x 30 centrally and unilaterally on the R.  STM to decrease pain and spasms along the multifidus in prone utilizing superficial techniques Patient demonstrates no increase in pain throughout session   PT Education - 10/04/17 0935    Education provided  Yes    Education Details  HEP: Prone press ups    Person(s) Educated  Patient    Methods  Explanation;Demonstration    Comprehension  Verbalized understanding;Returned demonstration          PT Long Term Goals - 09/22/17 1054      PT LONG TERM GOAL #1   Title  Patient will be independent with HEP to continue benefits of therapy after discharge.     Baseline  Moderate cueing to exercise performance and progression    Time  6    Period  Weeks    Status  New    Target Date  11/03/17  PT LONG TERM GOAL #2   Title  Patient will be able to play a full round of golf without increase in pain to improve ability to perform recreational activities.     Baseline  Unable to play golf without increase in pain    Time  6    Period  Weeks    Status  New    Target Date  11/03/17      PT LONG TERM GOAL #3   Title  Patient will have a worst pain of 1/10 to indicate functional improvement with goals and return to ADLs without pain    Baseline  4/10 worst pain    Time  6    Period  Weeks    Status  New    Target Date  11/03/17            Plan - 10/04/17 2778    Clinical Impression Statement  Patient demosntrates functional improvement with exercise and pain with greater ability to perform higher level exercises compared to previous treatment sessions. Patient demosntrates increased pain with performance of lumbar flexion in standing. Patient will benefit from  further skilled therapy to return to prior level of function.     Rehab Potential  Good    Clinical Impairments Affecting Rehab Potential  Improvement of 70% already in symptom intensity and frequency     PT Frequency  2x / week    PT Duration  6 weeks    PT Treatment/Interventions  Balance training;Electrical Stimulation;Iontophoresis 4mg /ml Dexamethasone;Cryotherapy;Therapeutic activities;Neuromuscular re-education;Stair training;Gait training;Moist Heat;Manual techniques;Therapeutic exercise;Passive range of motion;Dry needling    PT Next Visit Plan  Progress strengthening and decreasing pain    PT Home Exercise Plan  See education section    Consulted and Agree with Plan of Care  Patient       Patient will benefit from skilled therapeutic intervention in order to improve the following deficits and impairments:  Abnormal gait, Pain, Hypomobility, Difficulty walking, Decreased activity tolerance, Decreased range of motion, Decreased strength  Visit Diagnosis: Acute bilateral low back pain without sciatica  Other muscle spasm     Problem List Patient Active Problem List   Diagnosis Date Noted  . Cervical spondylosis without myelopathy 10/24/2014  . 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 Degeneration of cervical intervertebral disc 10/24/2014  . Sebaceous cyst 02/06/2014    Blythe Stanford, PT DPT 10/04/2017, 10:35 AM  [place] [facility] [address] [place], [address] Phone: [phone]   Fax:  [phone]  Name: Dennis Barrera MRN: [id number] Date of Birth: 17-Nov-1950

## 2017-10-06 ENCOUNTER — Ambulatory Visit: Payer: 59

## 2017-10-13 ENCOUNTER — Ambulatory Visit: Payer: 59

## 2017-10-13 DIAGNOSIS — M62838 Other muscle spasm: Secondary | ICD-10-CM

## 2017-10-13 DIAGNOSIS — M545 Low back pain, unspecified: Secondary | ICD-10-CM

## 2017-10-13 NOTE — Therapy (Signed)
[place] [facility] [address] [place], [address] Phone: [phone]   Fax:  [phone]  Physical Therapy Treatment  Patient Details  Name: Dennis Barrera MRN: [id number] Date of Birth: May 09, 1950 Referring Provider: Rosanna Randy MD   Encounter Date: 10/13/2017  PT End of Session - 10/13/17 1307    Visit Number  3    Number of Visits  13    Date for PT Re-Evaluation  11/03/17    Authorization Type  3/10 Progress note    PT Start Time  0900    PT Stop Time  0945    PT Time Calculation (min)  45 min    Activity Tolerance  Patient tolerated treatment well    Behavior During Therapy  [facility] for tasks assessed/performed       History reviewed. No pertinent past medical history.  Past Surgical History:  Procedure Laterality Date  . COLONOSCOPY WITH PROPOFOL N/A 07/06/2016   Procedure: COLONOSCOPY WITH PROPOFOL;  Surgeon: [name], MD;  Location: [facility] ENDOSCOPY;  Service: Endoscopy;  Laterality: N/A;  . CYST EXCISION  02/05/14   lower back  . SHOULDER SURGERY    . TONSILLECTOMY    . WISDOM TOOTH EXTRACTION      There were no vitals filed for this visit.  Subjective Assessment - 10/13/17 1304    Subjective  Patient demonstrates improvement with ability to perform greater amount of activities without pain. However, patient continues to have pain with waking up in the morning and sitting for prolonged periods of time.     Pertinent History  Patient had a prior episode which felt more "musculaur" which resolved with regular exercise and therapy over the course of several years.     Limitations  Lifting;Sitting;Standing;Walking    Diagnostic tests  X-ray of hips - negative, of spine indicative of early arthritis.     Patient Stated Goals  To be able to golf again.     Currently in Pain?  No/denies    Pain Onset  More than a month ago       TREATMENT Therapeutic Exercise  Hip abduction at hip machine - x20 B  55# Seated multifidus crunch with RTB - x 20, YTB - x 20  Lumbar rotation with arms away from chest - BTB x20  B Back extension in standing with 2kg ball - 2x 20  Kettlebell swings - x 20 40# , x20 20# Rotational squat lifts with 3kg ball - x 20 B CKC lumbar extension with arms extended - x 20  Patient demonstrates increased fatigue at the end of the session   PT Education - 10/13/17 1306    Education provided  Yes    Education Details  Rotational squat lifts     Person(s) Educated  Patient    Methods  Explanation;Demonstration    Comprehension  Verbalized understanding;Returned demonstration          PT Long Term Goals - 09/22/17 1054      PT LONG TERM GOAL #1   Title  Patient will be independent with HEP to continue benefits of therapy after discharge.     Baseline  Moderate cueing to exercise performance and progression    Time  6    Period  Weeks    Status  New    Target Date  11/03/17      PT LONG TERM GOAL #2   Title  Patient will be able to play a full  round of golf without increase in pain to improve ability to perform recreational activities.     Baseline  Unable to play golf without increase in pain    Time  6    Period  Weeks    Status  New    Target Date  11/03/17      PT LONG TERM GOAL #3   Title  Patient will have a worst pain of 1/10 to indicate functional improvement with goals and return to ADLs without pain    Baseline  4/10 worst pain    Time  6    Period  Weeks    Status  New    Target Date  11/03/17            Plan - 10/13/17 1307    Clinical Impression Statement  Patient demonstrates improvement with ability to perform higher level intensity exercises compared to previous exercises indicating functional carryover between sessions. Although patient is improving, he continues to have increased pain with performing exercises in standing most notably with performing hip/back extension. Patient will benefit from further skilled therapy to  return to prior level of function.    Rehab Potential  Good    Clinical Impairments Affecting Rehab Potential  Improvement of 70% already in symptom intensity and frequency     PT Frequency  2x / week    PT Duration  6 weeks    PT Treatment/Interventions  Balance training;Electrical Stimulation;Iontophoresis 4mg /ml Dexamethasone;Cryotherapy;Therapeutic activities;Neuromuscular re-education;Stair training;Gait training;Moist Heat;Manual techniques;Therapeutic exercise;Passive range of motion;Dry needling    PT Next Visit Plan  Progress strengthening and decreasing pain    PT Home Exercise Plan  See education section    Consulted and Agree with Plan of Care  Patient       Patient will benefit from skilled therapeutic intervention in order to improve the following deficits and impairments:  Abnormal gait, Pain, Hypomobility, Difficulty walking, Decreased activity tolerance, Decreased range of motion, Decreased strength  Visit Diagnosis: Acute bilateral low back pain without sciatica  Other muscle spasm     Problem List Patient Active Problem List   Diagnosis Date Noted  . Cervical spondylosis without myelopathy 10/24/2014  . 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 Degeneration of cervical intervertebral disc 10/24/2014  . Sebaceous cyst 02/06/2014    Blythe Stanford, PT DPT 10/13/2017, 1:17 PM  [place] [facility] [address] [place], [address] Phone: [phone]   Fax:  [phone]  Name: Dennis Barrera MRN: [id number] Date of Birth: [birth year]

## 2017-10-20 ENCOUNTER — Ambulatory Visit: Payer: 59

## 2017-10-20 DIAGNOSIS — M545 Low back pain, unspecified: Secondary | ICD-10-CM

## 2017-10-20 DIAGNOSIS — M62838 Other muscle spasm: Secondary | ICD-10-CM

## 2017-10-20 NOTE — Therapy (Signed)
[facility] [address] [place], [address] Phone: [phone]   Fax:  [phone]  Physical Therapy Treatment  Patient Details  Name: Dennis Barrera MRN: [id number] Date of Birth: [birth year] Referring Provider: Rosanna Randy MD   Encounter Date: 10/20/2017  PT End of Session - 10/20/17 0957    Visit Number  4    Number of Visits  13    Date for PT Re-Evaluation  11/03/17    Authorization Type  4/10 Progress note    PT Start Time  0900    PT Stop Time  0945    PT Time Calculation (min)  45 min    Activity Tolerance  Patient tolerated treatment well    Behavior During Therapy  [facility] for tasks assessed/performed       History reviewed. No pertinent past medical history.  Past Surgical History:  Procedure Laterality Date  . COLONOSCOPY WITH PROPOFOL N/A 07/06/2016   Procedure: COLONOSCOPY WITH PROPOFOL;  Surgeon: [name], MD;  Location: [facility] ENDOSCOPY;  Service: Endoscopy;  Laterality: N/A;  . CYST EXCISION  02/05/14   lower back  . SHOULDER SURGERY    . TONSILLECTOMY    . WISDOM TOOTH EXTRACTION      There were no vitals filed for this visit.  Subjective Assessment - 10/20/17 0923    Subjective  Patient reports he continues to improve in pain and spasms in the morning and states they continue to become lower everyday. Patient reports a slight increase in pain with picking up his grandson    Pertinent History  Patient had a prior episode which felt more "musculaur" which resolved with regular exercise and therapy over the course of several years.     Limitations  Lifting;Sitting;Standing;Walking    Diagnostic tests  X-ray of hips - negative, of spine indicative of early arthritis.     Patient Stated Goals  To be able to golf again.     Currently in Pain?  No/denies    Pain Onset  More than a month ago        TREATMENT Therapeutic Exercise  Kettlebell swings - 2 x 20 40#  Single leg DL in  standing - x 25 10# Squat lifts with 30# box - 3 x 15 Single leg chest press with 10# weight in standing - x 15 Single leg rotations in standing with 5# weight - x 20  Standing rotations with therapist with 25# weight - x 20 B Hip abduction at hip machine - x 20 40#  Hip extension at hip machine - x 20 85#   Patient demonstrates increased fatigue at the end of the session    PT Education - 10/20/17 0956    Education provided  Yes    Education Details  form/technique with exercises    Person(s) Educated  Patient    Methods  Explanation;Demonstration    Comprehension  Verbalized understanding;Returned demonstration          PT Long Term Goals - 09/22/17 1054      PT LONG TERM GOAL #1   Title  Patient will be independent with HEP to continue benefits of therapy after discharge.     Baseline  Moderate cueing to exercise performance and progression    Time  6    Period  Weeks    Status  New    Target Date  11/03/17      PT LONG TERM GOAL #2   Title  Patient will be able to play a full round of golf without increase in pain to improve ability to perform recreational activities.     Baseline  Unable to play golf without increase in pain    Time  6    Period  Weeks    Status  New    Target Date  11/03/17      PT LONG TERM GOAL #3   Title  Patient will have a worst pain of 1/10 to indicate functional improvement with goals and return to ADLs without pain    Baseline  4/10 worst pain    Time  6    Period  Weeks    Status  New    Target Date  11/03/17            Plan - 10/20/17 0957    Clinical Impression Statement  Patient demonstrates improvement with ability to perform higher intensity of exercises. Patient demonstrates frequent cueing to activate gluteal musculature during exercises to help decrease pressure along lumbar spine. Patient will benefit from further skilled therapy to return to prior level of function.     Rehab Potential  Good    Clinical Impairments  Affecting Rehab Potential  Improvement of 70% already in symptom intensity and frequency     PT Frequency  2x / week    PT Duration  6 weeks    PT Treatment/Interventions  Balance training;Electrical Stimulation;Iontophoresis 4mg /ml Dexamethasone;Cryotherapy;Therapeutic activities;Neuromuscular re-education;Stair training;Gait training;Moist Heat;Manual techniques;Therapeutic exercise;Passive range of motion;Dry needling    PT Next Visit Plan  Progress strengthening and decreasing pain    PT Home Exercise Plan  See education section    Consulted and Agree with Plan of Care  Patient       Patient will benefit from skilled therapeutic intervention in order to improve the following deficits and impairments:  Abnormal gait, Pain, Hypomobility, Difficulty walking, Decreased activity tolerance, Decreased range of motion, Decreased strength  Visit Diagnosis: Acute bilateral low back pain without sciatica  Other muscle spasm     Problem List Patient Active Problem List   Diagnosis Date Noted  . Cervical spondylosis without myelopathy 10/24/2014  . 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 Degeneration of cervical intervertebral disc 10/24/2014  . Sebaceous cyst 02/06/2014    Blythe Stanford, PT DPT 10/20/2017, 10:06 AM  [facility] [address], [place], [address] Phone: [phone]   Fax:  [phone]  Name: Dennis Barrera MRN: [id number] Date of Birth: [birth year]

## 2017-10-27 ENCOUNTER — Ambulatory Visit: Payer: 59

## 2017-11-01 ENCOUNTER — Ambulatory Visit: Payer: 59 | Attending: Family Medicine

## 2018-04-21 ENCOUNTER — Other Ambulatory Visit: Payer: Self-pay | Admitting: Family Medicine

## 2018-04-21 DIAGNOSIS — N529 Male erectile dysfunction, unspecified: Secondary | ICD-10-CM

## 2019-10-15 ENCOUNTER — Encounter: Payer: Self-pay | Admitting: Family Medicine

## 2019-10-15 ENCOUNTER — Ambulatory Visit (INDEPENDENT_AMBULATORY_CARE_PROVIDER_SITE_OTHER): Payer: 59 | Admitting: Family Medicine

## 2019-10-15 ENCOUNTER — Other Ambulatory Visit: Payer: Self-pay

## 2019-10-15 VITALS — BP 124/76 | HR 76 | Temp 97.3°F | Resp 16 | Ht 73.0 in | Wt 199.4 lb

## 2019-10-15 DIAGNOSIS — M7062 Trochanteric bursitis, left hip: Secondary | ICD-10-CM | POA: Diagnosis not present

## 2019-10-15 MED ORDER — MELOXICAM 15 MG PO TABS: 15.0000 mg | ORAL_TABLET | Freq: Every day | ORAL | 1 refills | Status: DC

## 2019-10-15 NOTE — Progress Notes (Signed)
Established patient visit   Patient: Dennis Barrera   DOB: [birth year]   69 y.o. Male  MRN: [id number] Visit Date: 10/15/2019  Today's healthcare provider: Vernie Murders, PA   Chief Complaint  Patient presents with  . Hip Pain   Subjective    Hip Pain  The incident occurred more than 1 week ago. There was no injury mechanism. The pain is present in the left hip. The quality of the pain is described as burning. The pain is mild. The pain has been fluctuating since onset. Associated symptoms include a loss of motion, a loss of sensation and tingling. Pertinent negatives include no muscle weakness or numbness. The symptoms are aggravated by movement. He has tried NSAIDs for the symptoms. The treatment provided moderate relief.     Patient Active Problem List   Diagnosis Date Noted  . Cervical spondylosis without myelopathy 10/24/2014  . 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 Degeneration of cervical intervertebral disc 10/24/2014  . Sebaceous cyst 02/06/2014    Past Surgical History:  Procedure Laterality Date  . COLONOSCOPY WITH PROPOFOL N/A 07/06/2016   Procedure: COLONOSCOPY WITH PROPOFOL;  Surgeon: [name], MD;  Location: [facility] ENDOSCOPY;  Service: Endoscopy;  Laterality: N/A;  . CYST EXCISION  02/05/14   lower back  . SHOULDER SURGERY    . TONSILLECTOMY    . WISDOM TOOTH EXTRACTION     Family History  Problem Relation Age of Onset  . Hypertension Mother   . Stroke Father   . Osteoarthritis Father   . Hypertension Brother   . Heart disease Maternal Grandfather    No Known Allergies  Social History   Tobacco Use  . Smoking status: Never Smoker  . Smokeless tobacco: Never Used  Substance Use Topics  . Alcohol use: Yes    Comment: 1 drink a month  . Drug use: No   Medications: Outpatient Medications Prior to Visit  Medication Sig  . aspirin 81 MG tablet Take 81 mg by mouth as needed.   . sildenafil (REVATIO) 20 MG tablet TAKE ONE TABLET BY  MOUTH DAILY AS NEEDED (Patient not taking: Reported on 10/15/2019)  . [DISCONTINUED] cyclobenzaprine (FLEXERIL) 10 MG tablet Take 1 tablet (10 mg total) by mouth at bedtime. (Patient not taking: Reported on 10/15/2019)  . [DISCONTINUED] naproxen (EC NAPROSYN) 500 MG EC tablet Take 1 tablet (500 mg total) by mouth 2 (two) times daily with a meal. (Patient not taking: Reported on 10/15/2019)   No facility-administered medications prior to visit.   Review of Systems  Constitutional: Negative.   Respiratory: Negative.   Cardiovascular: Negative.   Musculoskeletal: Positive for arthralgias and myalgias.  Neurological: Positive for tingling. Negative for numbness.      Objective    BP 124/76 (BP Location: Left Arm, Patient Position: Sitting, Cuff Size: Normal)   Pulse 76   Temp (!) 97.3 F (36.3 C) (Temporal)   Resp 16   Ht 6\' 1"  (1.854 m)   Wt 199 lb 6.4 oz (90.4 kg)   SpO2 98%   BMI 26.31 kg/m  BP Readings from Last 3 Encounters:  10/15/19 124/76  09/05/17 124/76  08/09/16 128/74   Wt Readings from Last 3 Encounters:  10/15/19 199 lb 6.4 oz (90.4 kg)  09/05/17 204 lb (92.5 kg)  08/09/16 204 lb (92.5 kg)    Physical Exam Constitutional:      General: He is not in acute distress.    Appearance: He is well-developed.  HENT:  Head: Normocephalic and atraumatic.     Right Ear: Hearing normal.     Left Ear: Hearing normal.     Nose: Nose normal.  Eyes:     General: Lids are normal. No scleral icterus.       Right eye: No discharge.        Left eye: No discharge.     Conjunctiva/sclera: Conjunctivae normal.  Pulmonary:     Effort: Pulmonary effort is normal. No respiratory distress.  Musculoskeletal:        General: Tenderness present. Normal range of motion.     Comments: Some tenderness and burning to palpate the left greater trochanter. Fair ROM without significant radiation of pain. Some purpuric rash on the left lower leg anteriorly - resolving.  Skin:    Findings:  No lesion or rash.  Neurological:     Mental Status: He is alert and oriented to person, place, and time.  Psychiatric:        Speech: Speech normal.        Behavior: Behavior normal.        Thought Content: Thought content normal.       No results found for any visits on 10/15/19.  Assessment & Plan     1. Trochanteric bursitis of left hip Burning pain in the left lateral hip to lie down on it or with twisting movements over the past 2 weeks. Started after spasm with hitting some golf balls working on a specific shot. Given bursitis hand out and treat with moist heat and Meloxicam. Rechecl prn. - meloxicam (MOBIC) 15 MG tablet; Take 1 tablet (15 mg total) by mouth daily.  Dispense: 30 tablet; Refill: 1    No follow-ups on file.      Andres Shad, PA, have reviewed all documentation for this visit. The documentation on 10/15/19 for the exam, diagnosis, procedures, and orders are all accurate and complete.    Vernie Murders, [place] [phone] (phone) [phone] (fax)  [place]

## 2019-10-15 NOTE — Patient Instructions (Signed)
Hip Bursitis  Hip bursitis is inflammation of a fluid-filled sac (bursa) in the hip joint. The bursa prevents the bones in the hip joint from rubbing against each other. Hip bursitis can cause mild to moderate pain, and symptoms often come and go over time. What are the causes? This condition may be caused by:  Injury to the hip.  Overuse of the muscles that surround the hip joint.  Previous injury or surgery of the hip.  Arthritis or gout.  Diabetes.  Thyroid disease.  Infection. In some cases, the cause may not be known. What are the signs or symptoms? Symptoms of this condition include:  Mild or moderate pain in the hip area. Pain may get worse with movement.  Tenderness and swelling of the hip, especially on the outer side of the hip.  In rare cases, the bursa may become infected. This may cause a fever, as well as warmth and redness in the area. Symptoms may come and go. How is this diagnosed? This condition may be diagnosed based on:  A physical exam.  Your medical history.  X-rays.  Removal of fluid from your inflamed bursa for testing (biopsy). You may be sent to a health care provider who specializes in bone diseases (orthopedist) or a provider who specializes in joint inflammation (rheumatologist). How is this treated? This condition is treated by resting, icing, applying pressure (compression), and raising (elevating) the injured area. This is called RICE treatment. In some cases, this may be enough to make your symptoms go away. Treatment may also include:  Using crutches.  Draining fluid out of the bursa to help relieve swelling.  Injecting medicine that helps to reduce inflammation (cortisone).  Additional medicines if the bursa is infected. Follow these instructions at home: Managing pain, stiffness, and swelling   If directed, put ice on the painful area. ? Put ice in a plastic bag. ? Place a towel between your skin and the bag. ? Leave the ice  on for 20 minutes, 2-3 times a day. ? Raise (elevate) your hip above the level of your heart as much as you can without pain. To do this, try putting a pillow under your hips while you lie down. Activity  Return to your normal activities as told by your health care provider. Ask your health care provider what activities are safe for you.  Rest and protect your hip as much as possible until your pain and swelling get better. General instructions  Take over-the-counter and prescription medicines only as told by your health care provider.  Wear compression wraps only as told by your health care provider.  Do not use your hip to support your body weight until your health care provider says that you can. Use crutches as told by your health care provider.  Gently massage and stretch your injured area as often as is comfortable.  Keep all follow-up visits as told by your health care provider. This is important. How is this prevented?  Exercise regularly, as told by your health care provider.  Warm up and stretch before being active.  Cool down and stretch after being active.  If an activity irritates your hip or causes pain, avoid the activity as much as possible.  Avoid sitting down for long periods at a time. Contact a health care provider if you:  Have a fever.  Develop new symptoms.  Have difficulty walking or doing everyday activities.  Have pain that gets worse or does not get better with medicine.    Develop red skin or a feeling of warmth in your hip area. Get help right away if you:  Cannot move your hip.  Have severe pain. Summary  Hip bursitis is inflammation of a fluid-filled sac (bursa) in the hip joint.  Hip bursitis can cause mild to moderate pain, and symptoms often come and go over time.  This condition is treated with rest, ice, compression, elevation, and medicines. This information is not intended to replace advice given to you by your health care  provider. Make sure you discuss any questions you have with your health care provider. Document Revised: 12/26/2017 Document Reviewed: 12/26/2017 Elsevier Patient Education  2020 Elsevier Inc[employer]  

## 2019-11-14 ENCOUNTER — Ambulatory Visit (INDEPENDENT_AMBULATORY_CARE_PROVIDER_SITE_OTHER): Payer: 59 | Admitting: Family Medicine

## 2019-11-14 ENCOUNTER — Other Ambulatory Visit: Payer: Self-pay

## 2019-11-14 ENCOUNTER — Encounter: Payer: Self-pay | Admitting: Family Medicine

## 2019-11-14 VITALS — BP 135/78 | HR 87 | Temp 97.1°F | Resp 16 | Ht 73.0 in | Wt 203.0 lb

## 2019-11-14 DIAGNOSIS — Z1389 Encounter for screening for other disorder: Secondary | ICD-10-CM

## 2019-11-14 DIAGNOSIS — Z114 Encounter for screening for human immunodeficiency virus [HIV]: Secondary | ICD-10-CM

## 2019-11-14 DIAGNOSIS — Z1211 Encounter for screening for malignant neoplasm of colon: Secondary | ICD-10-CM

## 2019-11-14 DIAGNOSIS — Z125 Encounter for screening for malignant neoplasm of prostate: Secondary | ICD-10-CM

## 2019-11-14 DIAGNOSIS — Z136 Encounter for screening for cardiovascular disorders: Secondary | ICD-10-CM

## 2019-11-14 DIAGNOSIS — Z23 Encounter for immunization: Secondary | ICD-10-CM

## 2019-11-14 DIAGNOSIS — Z0000 Encounter for general adult medical examination without abnormal findings: Secondary | ICD-10-CM

## 2019-11-14 DIAGNOSIS — Z130 Encounter for screening for diseases of the blood and blood-forming organs and certain disorders involving the immune mechanism: Secondary | ICD-10-CM

## 2019-11-14 DIAGNOSIS — Z1329 Encounter for screening for other suspected endocrine disorder: Secondary | ICD-10-CM

## 2019-11-14 DIAGNOSIS — Z13228 Encounter for screening for other metabolic disorders: Secondary | ICD-10-CM

## 2019-11-14 NOTE — Progress Notes (Signed)
Complete physical exam  I,Dennis Barrera,acting as a scribe for Dennis Durie, MD.,have documented all relevant documentation on the behalf of Dennis Durie, MD,as directed by  Dennis Durie, MD while in the presence of Dennis Durie, MD.   Patient: Dennis Barrera   DOB: 03/21/1951   69 y.o. Male  MRN: [id number] Visit Date: 11/14/2019  Today's healthcare provider: [name], MD   Chief Complaint  Patient presents with   Annual Exam   Subjective    Dennis Barrera is a 69 y.o. male who presents today for a complete physical exam.  He reports consuming a general diet. Home exercise routine includes walking. He generally feels well. He reports sleeping well. He does not have additional problems to discuss today. He is married father of 1 with 2 GC with 3rd on the way. He [plans to retire later this year. HPI   Last colonoscopy: 07/06/16 History reviewed. No pertinent past medical history. Past Surgical History:  Procedure Laterality Date   COLONOSCOPY WITH PROPOFOL N/A 07/06/2016   Procedure: COLONOSCOPY WITH PROPOFOL;  Surgeon: [name], MD;  Location: [facility] ENDOSCOPY;  Service: Endoscopy;  Laterality: N/A;   CYST EXCISION  02/05/14   lower back   SHOULDER SURGERY     TONSILLECTOMY     WISDOM TOOTH EXTRACTION     Social History   Socioeconomic History   Marital status: Married    Spouse name: Not on file   Number of children: Not on file   Years of education: Not on file   Highest education level: Not on file  Occupational History   Not on file  Tobacco Use   Smoking status: Never Smoker   Smokeless tobacco: Never Used  Substance and Sexual Activity   Alcohol use: Yes    Comment: 1 drink a month   Drug use: No   Sexual activity: Not on file  Other Topics Concern   Not on file  Social History Narrative   Not on file   Social Determinants of Health   Financial Resource Strain:    Difficulty of Paying Living  Expenses:   Food Insecurity:    Worried About Charity fundraiser in the Last Year:    Arboriculturist in the Last Year:   Transportation Needs:    Film/video editor (Medical):    Lack of Transportation (Non-Medical):   Physical Activity:    Days of Exercise per Week:    Minutes of Exercise per Session:   Stress:    Feeling of Stress :   Social Connections:    Frequency of Communication with Friends and Family:    Frequency of Social Gatherings with Friends and Family:    Attends Religious Services:    Active Member of Clubs or Organizations:    Attends Music therapist:    Marital Status:   Intimate Partner Violence:    Fear of Current or Ex-Partner:    Emotionally Abused:    Physically Abused:    Sexually Abused:    Family Status  Relation Name Status   Mother  Deceased   Father  Deceased   Sister  Alive   Brother  Deceased   MGF  Deceased at age 30's   Family History  Problem Relation Age of Onset   Hypertension Mother    Stroke Father    Osteoarthritis Father    Hypertension Brother    Heart disease Maternal Grandfather  No Known Allergies  Patient Care Team: Jerrol Banana., MD as PCP - General (Family Medicine) Jerrol Banana., MD (Family Medicine) Bary Castilla Forest Gleason, MD (General Surgery)   Medications: Outpatient Medications Prior to Visit  Medication Sig   aspirin 81 MG tablet Take 81 mg by mouth as needed.    meloxicam (MOBIC) 15 MG tablet Take 1 tablet (15 mg total) by mouth daily.   sildenafil (REVATIO) 20 MG tablet TAKE ONE TABLET BY MOUTH DAILY AS NEEDED (Patient not taking: Reported on 10/15/2019)   No facility-administered medications prior to visit.    Review of Systems  All other systems reviewed and are negative.     Objective    BP 135/78 (BP Location: Left Arm, Patient Position: Sitting, Cuff Size: Large)    Pulse 87    Temp (!) 97.1 F (36.2 C) (Other (Comment))    Resp  16    Ht 6\' 1"  (1.854 m)    Wt 203 lb (92.1 kg)    SpO2 98%    BMI 26.78 kg/m    Physical Exam Vitals and nursing note reviewed.  Constitutional:      Appearance: Normal appearance. He is normal weight.  HENT:     Right Ear: Tympanic membrane normal.     Left Ear: Tympanic membrane normal.     Nose: Nose normal.     Mouth/Throat:     Mouth: Mucous membranes are moist.  Cardiovascular:     Rate and Rhythm: Normal rate and regular rhythm.     Pulses: Normal pulses.     Heart sounds: Normal heart sounds.  Pulmonary:     Effort: Pulmonary effort is normal.     Breath sounds: Normal breath sounds.  Abdominal:     General: Bowel sounds are normal.     Palpations: Abdomen is soft.  Genitourinary:    Penis: Normal.      Testes: Normal.     Comments: Moderat size left inguinal hernia. Musculoskeletal:     Cervical back: Normal range of motion and neck supple.     Right lower leg: No edema.     Left lower leg: No edema.  Skin:    General: Skin is warm.  Neurological:     General: No focal deficit present.     Mental Status: He is alert and oriented to person, place, and time.  Psychiatric:        Mood and Affect: Mood normal.        Behavior: Behavior normal.       Last depression screening scores PHQ 2/9 Scores 11/14/2019 10/15/2019 09/05/2017  PHQ - 2 Score 0 0 0  PHQ- 9 Score 0 - -   Last fall risk screening Fall Risk  10/15/2019  Falls in the past year? 0  Number falls in past yr: 0  Injury with Fall? 0  Risk for fall due to : No Fall Risks  Follow up Falls evaluation completed   Last Audit-C alcohol use screening Alcohol Use Disorder Test (AUDIT) 11/14/2019  1. How often do you have a drink containing alcohol? 2  2. How many drinks containing alcohol do you have on a typical day when you are drinking? 0  3. How often do you have six or more drinks on one occasion? 0  AUDIT-C Score 2  Alcohol Brief Interventions/Follow-up AUDIT Score <7 follow-up not indicated   A  score of 3 or more in women, and 4 or more in men  indicates increased risk for alcohol abuse, EXCEPT if all of the points are from question 1   No results found for any visits on 11/14/19.  Assessment & Plan    Routine Health Maintenance and Physical Exam  Exercise Activities and Dietary recommendations Goals   None     Immunization History  Administered Date(s) Administered   Influenza-Unspecified 03/04/2019   Pneumococcal Polysaccharide-23 11/14/2019   Tdap 10/25/2014    Health Maintenance  Topic Date Due   COVID-19 Vaccine (1) Never done   INFLUENZA VACCINE  12/02/2019   PNA vac Low Risk Adult (2 of 2 - PCV13) 11/13/2020   TETANUS/TDAP  10/24/2024   COLONOSCOPY  07/07/2026   Hepatitis C Screening  Completed    Discussed health benefits of physical activity, and encouraged him to engage in regular exercise appropriate for his age and condition.  1. Annual physical exam  - Comprehensive metabolic panel - CBC - Lipid panel - TSH - PSA - HIV Antibody (routine testing w rflx)  2. Encounter for special screening examination for cardiovascular disorder  - Lipid panel  3. Encounter for screening for hematologic disorder  - CBC  4. Screening for hematuria or proteinuria   5. Encounter for screening for HIV  - HIV Antibody (routine testing w rflx)  6. Screening for metabolic disorder  - Comprehensive metabolic panel  7. Prostate cancer screening  - PSA  8. Screening for thyroid disorder  - TSH  9. Need for pneumococcal vaccination  - Pneumococcal polysaccharide vaccine 23-valent greater than or equal to 2yo subcutaneous/IM - Ambulatory referral to Gastroenterology   Return in 1 year (on 11/18/2020) for CPE.     I, Dennis Durie, MD, have reviewed all documentation for this visit. The documentation on 11/16/19 for the exam, diagnosis, procedures, and orders are all accurate and complete.    Bengie Kaucher Cranford Mon, MD  [facility] [phone] (phone) [phone] (fax)  [place]

## 2019-11-21 ENCOUNTER — Telehealth: Payer: Self-pay | Admitting: Family Medicine

## 2019-11-21 NOTE — Telephone Encounter (Signed)
Pt in earlier and had a rash on lower leg and had a discussion with Dr Darnell Level about possibility of shingles. Rash cleared up but since Daughter has gotten pregnant and wants feed back from Dr Rosanna Randy if he thinks it would be ok to see daughter. Pls give FU call at [phone]

## 2019-11-22 LAB — CBC
Hematocrit: 44.1 % (ref 37.5–51.0)
Hemoglobin: 14.8 g/dL (ref 13.0–17.7)
MCH: 27.6 pg (ref 26.6–33.0)
MCHC: 33.6 g/dL (ref 31.5–35.7)
MCV: 82 fL (ref 79–97)
Platelets: 340 10*3/uL (ref 150–450)
RBC: 5.36 x10E6/uL (ref 4.14–5.80)
RDW: 12.7 % (ref 11.6–15.4)
WBC: 5.7 10*3/uL (ref 3.4–10.8)

## 2019-11-22 LAB — COMPREHENSIVE METABOLIC PANEL
ALT: 18 IU/L (ref 0–44)
AST: 19 IU/L (ref 0–40)
Albumin/Globulin Ratio: 1.4 (ref 1.2–2.2)
Albumin: 4.1 g/dL (ref 3.8–4.8)
Alkaline Phosphatase: 70 IU/L (ref 48–121)
BUN/Creatinine Ratio: 22 (ref 10–24)
BUN: 20 mg/dL (ref 8–27)
Bilirubin Total: 0.5 mg/dL (ref 0.0–1.2)
CO2: 21 mmol/L (ref 20–29)
Calcium: 9.4 mg/dL (ref 8.6–10.2)
Chloride: 104 mmol/L (ref 96–106)
Creatinine, Ser: 0.91 mg/dL (ref 0.76–1.27)
GFR calc Af Amer: 100 mL/min/{1.73_m2} (ref >59)
GFR calc non Af Amer: 87 mL/min/{1.73_m2} (ref >59)
Globulin, Total: 2.9 g/dL (ref 1.5–4.5)
Glucose: 104 mg/dL — ABNORMAL HIGH (ref 65–99)
Potassium: 4.7 mmol/L (ref 3.5–5.2)
Sodium: 138 mmol/L (ref 134–144)
Total Protein: 7 g/dL (ref 6.0–8.5)

## 2019-11-22 LAB — TSH: TSH: 1.99 u[IU]/mL (ref 0.450–4.500)

## 2019-11-22 LAB — LIPID PANEL
Chol/HDL Ratio: 4.8 ratio (ref 0.0–5.0)
Cholesterol, Total: 230 mg/dL — ABNORMAL HIGH (ref 100–199)
HDL: 48 mg/dL (ref >39)
LDL Chol Calc (NIH): 162 mg/dL — ABNORMAL HIGH (ref 0–99)
Triglycerides: 110 mg/dL (ref 0–149)
VLDL Cholesterol Cal: 20 mg/dL (ref 5–40)

## 2019-11-22 LAB — PSA: Prostate Specific Ag, Serum: 1.3 ng/mL (ref 0.0–4.0)

## 2019-11-22 LAB — HIV ANTIBODY (ROUTINE TESTING W REFLEX): HIV Screen 4th Generation wRfx: NONREACTIVE

## 2019-11-22 NOTE — Telephone Encounter (Signed)
Patient advised as below.  

## 2019-11-22 NOTE — Telephone Encounter (Signed)
If rash is cleared then yes he can see daughter.

## 2019-12-11 ENCOUNTER — Other Ambulatory Visit: Payer: Self-pay | Admitting: General Surgery

## 2019-12-11 NOTE — Progress Notes (Signed)
Patient ID: Dennis Barrera is a 69 y.o. male.  HPI  The following portions of the patient's history were reviewed and updated as appropriate.  This an established patient is here today for: office visit. Patient has been referred by Dr. Rosanna Randy for evaluation of an left hernia. He states that about 6 months ago he noticed a left groin knot. He states that occasionally he has a "catch" but no real pain. Bowels move daily, no bleeding.  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  Enjoys golf and is off time.  Excellent general health.      Chief Complaint  Patient presents with  . Inguinal Hernia     BP (!) 164/90   Pulse 78   Temp 36.2 C (97.1 F)   Ht 185.4 cm (6\' 1" )   Wt 91.6 kg (202 lb)   SpO2 98%   BMI 26.65 kg/m   No past medical history on file.        Past Surgical History:  Procedure Laterality Date  . COLONOSCOPY  07/06/2016   Dr. Vicente Males  . lower back cyst excision  02/05/2014  . shoulder surgery    . TONSILLECTOMY    . wisdom tooth extraction        Social History          Socioeconomic History  . Marital status: Married    Spouse name: Not on file  . Number of children: Not on file  . Years of education: Not on file  . Highest education level: Not on file  Occupational History  . Not on file  Tobacco Use  . Smoking status: Former Smoker    Packs/day: 1.00    Years: 10.00    Pack years: 10.00    Types: Cigarettes    Quit date: 05/03/1978    Years since quitting: 41.6  . Smokeless tobacco: Never Used  Substance and Sexual Activity  . Alcohol use: Yes  . Drug use: Never  . Sexual activity: Not on file  Other Topics Concern  . Not on file  Social History Narrative  . Not on file   Social Determinants of Health      Financial Resource Strain:   . Difficulty of Paying Living Expenses:   Food Insecurity:   . Worried About Sales executive in the Last Year:   . Arboriculturist in the Last Year:   Transportation Needs:   . Film/video editor (Medical):   Marland Kitchen Lack of Transportation (Non-Medical):        No Known Allergies  Current Medications        Current Outpatient Medications  Medication Sig Dispense Refill  . aspirin 81 MG EC tablet Take by mouth once daily     No current facility-administered medications for this visit.           Family History  Problem Relation Age of Onset  . High blood pressure (Hypertension) Mother   . Stroke Father   . Osteoarthritis Father   . High blood pressure (Hypertension) Brother   . Heart disease Maternal Grandfather   . Colon cancer Neg Hx     Labs and Radiology:     Review of Systems  Constitutional: Negative for chills and fever.  Respiratory: Negative for cough.   Gastrointestinal: Negative for blood in stool, constipation and diarrhea.       Objective:   Physical Exam Constitutional:  Appearance: Normal appearance.  Cardiovascular:     Rate and Rhythm: Normal rate and regular rhythm.     Pulses: Normal pulses.     Heart sounds: Normal heart sounds.  Pulmonary:     Effort: Pulmonary effort is normal.     Breath sounds: Normal breath sounds.  Abdominal:     Palpations: Abdomen is soft.  Genitourinary:    Penis: Circumcised.      Testes: Normal.    Skin:    General: Skin is warm and dry.  Neurological:     Mental Status: He is alert and oriented to person, place, and time.  Psychiatric:        Mood and Affect: Mood normal.        Behavior: Behavior normal.        Assessment:     Mildly symptomatic left inguinal hernia.    Plan:     The hernia has been present for about 6 months, and the patient has a golf outing planned in the next few weeks.  It is reasonable to look at repair after the first of the month.  Role of prosthetic mesh and repair was reviewed.  Options for management including open versus  laparoscopic/robotic procedures discussed.  My preference is for an open repair and he is amenable.     Patient to follow up as scheduled and is aware to call for any new issues or concerns.  Entered by Ledell Noss, CMA, acting as a scribe for Dr. Hervey Ard, MD.   The documentation recorded by the scribe accurately reflects the service I personally performed and the decisions made by me.   Robert Bellow, MD FACS

## 2019-12-27 ENCOUNTER — Other Ambulatory Visit: Payer: Self-pay

## 2019-12-27 ENCOUNTER — Other Ambulatory Visit
Admission: RE | Admit: 2019-12-27 | Discharge: 2019-12-27 | Disposition: A | Discharge status: Home / Self Care | Payer: 59 | Source: Ambulatory Visit | Attending: General Surgery | Admitting: General Surgery

## 2019-12-27 NOTE — Patient Instructions (Signed)
Your procedure is scheduled on: Wednesday January 02, 2020. Report to Day Surgery inside [place] [facility] 2nd floor. To find out your arrival time please call [phone] between 1PM - 3PM on Tuesday January 01, 2020.  Remember: Instructions that are not followed completely may result in serious medical risk,  up to and including death, or upon the discretion of your surgeon and anesthesiologist your  surgery may need to be rescheduled.     _X__ 1. Do not eat food after midnight the night before your procedure.                 No chewing gum or hard candies. You may drink clear liquids up to 2 hours                 before you are scheduled to arrive for your surgery- DO not drink clear                 liquids within 2 hours of the start of your surgery.                 Clear Liquids include:  water, apple juice without pulp, clear Gatorade, G2 or                  Gatorade Zero (avoid Red/Purple/Blue), Black Coffee or Tea (Do not add                 anything to coffee or tea).  __X__2.  On the morning of surgery brush your teeth with toothpaste and water, you                may rinse your mouth with mouthwash if you wish.  Do not swallow any toothpaste of mouthwash.     _X__ 3.  No Alcohol for 24 hours before or after surgery.   _X__ 4.  Do Not Smoke or use e-cigarettes For 24 Hours Prior to Your Surgery.                 Do not use any chewable tobacco products for at least 6 hours prior to                 Surgery.  _X__  5.  Do not use any recreational drugs (marijuana, cocaine, heroin, ecstasy, MDMA or other)                For at least one week prior to your surgery.  Combination of these drugs with anesthesia                May have life threatening results.  __X__ 6.  Notify your doctor if there is any change in your medical condition      (cold, fever, infections).     Do not wear jewelry, make-up, hairpins, clips or nail polish. Do not wear lotions,  powders, or perfumes. You may wear deodorant. Do not shave 48 hours prior to surgery. Men may shave face and neck. Do not bring valuables to the hospital.    [facility] is not responsible for any belongings or valuables.  Contacts, dentures or bridgework may not be worn into surgery. Leave your suitcase in the car. After surgery it may be brought to your room. For patients admitted to the hospital, discharge time is determined by your treatment team.   Patients discharged the day of surgery will not be allowed to drive home.   Make arrangements for someone to be  with you for the first 24 hours of your Same Day Discharge.   __X__ Take these medicines the morning of surgery with A SIP OF WATER:    1. None    __X__ Use CHG Soap (or wipes) as directed  __X__ Stop aspirin 81 MG  As of 12/27/19     __X__ Stop Anti-inflammatories such as meloxicam (MOBIC), Ibuprofen, Aleve, Advil, naproxen, and or BC powders.    __X__ Stop supplements until after surgery.    __X__ Do not start any herbal supplements before your surgery.     If you have any questions regarding your pre-procedure instructions,  Please call Pre-admit Testing at [phone]

## 2019-12-31 ENCOUNTER — Other Ambulatory Visit
Admission: RE | Admit: 2019-12-31 | Discharge: 2019-12-31 | Disposition: A | Discharge status: Home / Self Care | Payer: 59 | Source: Ambulatory Visit | Attending: General Surgery | Admitting: General Surgery

## 2019-12-31 ENCOUNTER — Other Ambulatory Visit: Payer: Self-pay

## 2019-12-31 DIAGNOSIS — Z01818 Encounter for other preprocedural examination: Secondary | ICD-10-CM | POA: Diagnosis present

## 2019-12-31 DIAGNOSIS — Z20822 Contact with and (suspected) exposure to covid-19: Secondary | ICD-10-CM | POA: Insufficient documentation

## 2019-12-31 LAB — SARS CORONAVIRUS 2 (TAT 6-24 HRS): SARS Coronavirus 2: NEGATIVE

## 2020-01-02 ENCOUNTER — Other Ambulatory Visit: Payer: Self-pay

## 2020-01-02 ENCOUNTER — Ambulatory Visit: Payer: 59 | Admitting: Certified Registered Nurse Anesthetist

## 2020-01-02 ENCOUNTER — Ambulatory Visit
Admission: RE | Admit: 2020-01-02 | Discharge: 2020-01-02 | Disposition: A | Discharge status: Home / Self Care | Payer: 59 | Attending: General Surgery | Admitting: General Surgery

## 2020-01-02 ENCOUNTER — Encounter
Admission: RE | Disposition: A | Discharge status: Home / Self Care | Payer: Self-pay | Source: Home / Self Care | Attending: General Surgery

## 2020-01-02 ENCOUNTER — Encounter: Payer: Self-pay | Admitting: General Surgery

## 2020-01-02 DIAGNOSIS — Z823 Family history of stroke: Secondary | ICD-10-CM | POA: Diagnosis not present

## 2020-01-02 DIAGNOSIS — K4090 Unilateral inguinal hernia, without obstruction or gangrene, not specified as recurrent: Secondary | ICD-10-CM | POA: Insufficient documentation

## 2020-01-02 DIAGNOSIS — Z7982 Long term (current) use of aspirin: Secondary | ICD-10-CM | POA: Diagnosis not present

## 2020-01-02 DIAGNOSIS — Z8261 Family history of arthritis: Secondary | ICD-10-CM | POA: Insufficient documentation

## 2020-01-02 DIAGNOSIS — Z8249 Family history of ischemic heart disease and other diseases of the circulatory system: Secondary | ICD-10-CM | POA: Insufficient documentation

## 2020-01-02 DIAGNOSIS — Z791 Long term (current) use of non-steroidal anti-inflammatories (NSAID): Secondary | ICD-10-CM | POA: Insufficient documentation

## 2020-01-02 DIAGNOSIS — Z87891 Personal history of nicotine dependence: Secondary | ICD-10-CM | POA: Diagnosis not present

## 2020-01-02 HISTORY — PX: INGUINAL HERNIA REPAIR: SHX194

## 2020-01-02 SURGERY — REPAIR, HERNIA, INGUINAL, ADULT
Anesthesia: General | Laterality: Left

## 2020-01-02 MED ORDER — ONDANSETRON HCL 4 MG/2ML IJ SOLN: 4.0000 mg | Freq: Once | INTRAMUSCULAR | Status: DC | PRN

## 2020-01-02 MED ORDER — PROPOFOL 10 MG/ML IV BOLUS
INTRAVENOUS | Status: DC | PRN
  Administered 2020-01-02: 40 mg via INTRAVENOUS
  Administered 2020-01-02: 20 mg via INTRAVENOUS
  Administered 2020-01-02: 160 mg via INTRAVENOUS

## 2020-01-02 MED ORDER — HYDROCODONE-ACETAMINOPHEN 5-325 MG PO TABS: 1.0000 | ORAL_TABLET | ORAL | 0 refills | Status: DC | PRN

## 2020-01-02 MED ORDER — FAMOTIDINE 20 MG PO TABS
ORAL_TABLET | ORAL | Status: AC
  Administered 2020-01-02: 20 mg via ORAL
  Filled 2020-01-02: qty 1

## 2020-01-02 MED ORDER — ORAL CARE MOUTH RINSE: 15.0000 mL | Freq: Once | OROMUCOSAL | Status: AC

## 2020-01-02 MED ORDER — CEFAZOLIN SODIUM-DEXTROSE 2-4 GM/100ML-% IV SOLN
2.0000 g | INTRAVENOUS | Status: AC
  Administered 2020-01-02: 2 g via INTRAVENOUS

## 2020-01-02 MED ORDER — CHLORHEXIDINE GLUCONATE 0.12 % MT SOLN
OROMUCOSAL | Status: AC
  Administered 2020-01-02: 15 mL via OROMUCOSAL
  Filled 2020-01-02: qty 15

## 2020-01-02 MED ORDER — DEXAMETHASONE SODIUM PHOSPHATE 10 MG/ML IJ SOLN
INTRAMUSCULAR | Status: DC | PRN
  Administered 2020-01-02: 8 mg via INTRAVENOUS

## 2020-01-02 MED ORDER — ACETAMINOPHEN 10 MG/ML IV SOLN
INTRAVENOUS | Status: DC | PRN
  Administered 2020-01-02: 1000 mg via INTRAVENOUS

## 2020-01-02 MED ORDER — FENTANYL CITRATE (PF) 100 MCG/2ML IJ SOLN
INTRAMUSCULAR | Status: AC
  Filled 2020-01-02: qty 2

## 2020-01-02 MED ORDER — CHLORHEXIDINE GLUCONATE 0.12 % MT SOLN: 15.0000 mL | Freq: Once | OROMUCOSAL | Status: AC

## 2020-01-02 MED ORDER — ONDANSETRON HCL 4 MG/2ML IJ SOLN
INTRAMUSCULAR | Status: DC | PRN
  Administered 2020-01-02: 4 mg via INTRAVENOUS

## 2020-01-02 MED ORDER — MIDAZOLAM HCL 2 MG/2ML IJ SOLN
INTRAMUSCULAR | Status: DC | PRN
  Administered 2020-01-02: 2 mg via INTRAVENOUS

## 2020-01-02 MED ORDER — BUPIVACAINE-EPINEPHRINE (PF) 0.5% -1:200000 IJ SOLN
INTRAMUSCULAR | Status: DC | PRN
  Administered 2020-01-02: 30 mL

## 2020-01-02 MED ORDER — LACTATED RINGERS IV SOLN: INTRAVENOUS | Status: DC

## 2020-01-02 MED ORDER — PROPOFOL 10 MG/ML IV BOLUS
INTRAVENOUS | Status: AC
  Filled 2020-01-02: qty 40

## 2020-01-02 MED ORDER — MIDAZOLAM HCL 2 MG/2ML IJ SOLN
INTRAMUSCULAR | Status: AC
  Filled 2020-01-02: qty 2

## 2020-01-02 MED ORDER — BUPIVACAINE-EPINEPHRINE (PF) 0.5% -1:200000 IJ SOLN
INTRAMUSCULAR | Status: AC
  Filled 2020-01-02: qty 30

## 2020-01-02 MED ORDER — FENTANYL CITRATE (PF) 100 MCG/2ML IJ SOLN
INTRAMUSCULAR | Status: DC | PRN
Start: 2020-01-02 — End: 2020-01-02
  Administered 2020-01-02: 50 ug via INTRAVENOUS

## 2020-01-02 MED ORDER — LIDOCAINE HCL (CARDIAC) PF 100 MG/5ML IV SOSY
PREFILLED_SYRINGE | INTRAVENOUS | Status: DC | PRN
  Administered 2020-01-02: 100 mg via INTRAVENOUS

## 2020-01-02 MED ORDER — FENTANYL CITRATE (PF) 100 MCG/2ML IJ SOLN: 25.0000 ug | INTRAMUSCULAR | Status: DC | PRN

## 2020-01-02 MED ORDER — ACETAMINOPHEN 10 MG/ML IV SOLN
INTRAVENOUS | Status: AC
  Filled 2020-01-02: qty 100

## 2020-01-02 MED ORDER — KETOROLAC TROMETHAMINE 30 MG/ML IJ SOLN
INTRAMUSCULAR | Status: DC | PRN
  Administered 2020-01-02: 30 mg via INTRAVENOUS

## 2020-01-02 MED ORDER — CEFAZOLIN SODIUM-DEXTROSE 2-4 GM/100ML-% IV SOLN
INTRAVENOUS | Status: AC
  Filled 2020-01-02: qty 100

## 2020-01-02 MED ORDER — FAMOTIDINE 20 MG PO TABS: 20.0000 mg | ORAL_TABLET | Freq: Once | ORAL | Status: AC

## 2020-01-02 SURGICAL SUPPLY — 40 items
APL PRP STRL LF DISP 70% ISPRP (MISCELLANEOUS) ×1
APL SKNCLS STERI-STRIP NONHPOA (GAUZE/BANDAGES/DRESSINGS) ×1
BENZOIN TINCTURE PRP APPL 2/3 (GAUZE/BANDAGES/DRESSINGS) ×2 IMPLANT
BLADE SURG 15 STRL SS SAFETY (BLADE) ×6 IMPLANT
CANISTER SUCT 1200ML W/VALVE (MISCELLANEOUS) ×3 IMPLANT
CHLORAPREP W/TINT 26 (MISCELLANEOUS) ×3 IMPLANT
CLOSURE WOUND 1/2 X4 (GAUZE/BANDAGES/DRESSINGS) ×1
COVER WAND RF STERILE (DRAPES) ×3 IMPLANT
DECANTER SPIKE VIAL GLASS SM (MISCELLANEOUS) ×3 IMPLANT
DRAIN PENROSE 1/4X12 LTX STRL (WOUND CARE) ×3 IMPLANT
DRAPE LAPAROTOMY 100X77 ABD (DRAPES) ×3 IMPLANT
DRSG TEGADERM 4X10 (GAUZE/BANDAGES/DRESSINGS) ×2 IMPLANT
DRSG TEGADERM 4X4.75 (GAUZE/BANDAGES/DRESSINGS) ×3 IMPLANT
DRSG TELFA 4X3 1S NADH ST (GAUZE/BANDAGES/DRESSINGS) ×3 IMPLANT
ELECT REM PT RETURN 9FT ADLT (ELECTROSURGICAL) ×3
ELECTRODE REM PT RTRN 9FT ADLT (ELECTROSURGICAL) ×1 IMPLANT
GLOVE BIO SURGEON STRL SZ7.5 (GLOVE) ×3 IMPLANT
GLOVE INDICATOR 8.0 STRL GRN (GLOVE) ×3 IMPLANT
GOWN STRL REUS W/ TWL LRG LVL3 (GOWN DISPOSABLE) ×2 IMPLANT
GOWN STRL REUS W/TWL LRG LVL3 (GOWN DISPOSABLE) ×6
KIT TURNOVER KIT A (KITS) ×3 IMPLANT
LABEL OR SOLS (LABEL) ×3 IMPLANT
MESH HERNIA 6X12 ULTRAPRO MED (Mesh General) ×1 IMPLANT
MESH HERNIA ULTRAPRO MED (Mesh General) ×2 IMPLANT
NEEDLE HYPO 22GX1.5 SAFETY (NEEDLE) ×6 IMPLANT
PACK BASIN MINOR (MISCELLANEOUS) ×3 IMPLANT
STRIP CLOSURE SKIN 1/2X4 (GAUZE/BANDAGES/DRESSINGS) ×2 IMPLANT
SUT PDS AB 0 CT1 27 (SUTURE) ×3 IMPLANT
SUT SURGILON 0 BLK (SUTURE) ×6 IMPLANT
SUT VIC AB 2-0 SH 27 (SUTURE) ×3
SUT VIC AB 2-0 SH 27XBRD (SUTURE) ×1 IMPLANT
SUT VIC AB 3-0 54X BRD REEL (SUTURE) ×1 IMPLANT
SUT VIC AB 3-0 BRD 54 (SUTURE) ×3
SUT VIC AB 3-0 SH 27 (SUTURE) ×3
SUT VIC AB 3-0 SH 27X BRD (SUTURE) ×1 IMPLANT
SUT VIC AB 4-0 FS2 27 (SUTURE) ×3 IMPLANT
SWABSTK COMLB BENZOIN TINCTURE (MISCELLANEOUS) ×3 IMPLANT
SYR 10ML LL (SYRINGE) ×3 IMPLANT
SYR 3ML LL SCALE MARK (SYRINGE) ×3 IMPLANT
TAPE STRIPS DRAPE STRL (GAUZE/BANDAGES/DRESSINGS) ×2 IMPLANT

## 2020-01-02 NOTE — Anesthesia Procedure Notes (Signed)
Procedure Name: LMA Insertion Date/Time: 01/02/2020 12:27 PM Performed by: Lowry Bowl, CRNA Pre-anesthesia Checklist: Patient identified, Emergency Drugs available, Suction available and Patient being monitored Patient Re-evaluated:Patient Re-evaluated prior to induction Oxygen Delivery Method: Circle system utilized Preoxygenation: Pre-oxygenation with 100% oxygen Induction Type: IV induction Ventilation: Mask ventilation without difficulty LMA: LMA inserted LMA Size: 4.0 Number of attempts: 2 (First attempt w/ 4.5 LMA - did not seat well) Placement Confirmation: positive ETCO2 and breath sounds checked- equal and bilateral Tube secured with: Tape Dental Injury: Teeth and Oropharynx as per pre-operative assessment

## 2020-01-02 NOTE — Transfer of Care (Signed)
Immediate Anesthesia Transfer of Care Note  Patient: Dennis Barrera  Procedure(s) Performed: HERNIA REPAIR INGUINAL ADULT (Left )  Patient Location: PACU  Anesthesia Type:General  Level of Consciousness: awake, oriented, drowsy and patient cooperative  Airway & Oxygen Therapy: Patient Spontanous Breathing  Post-op Assessment: Report given to RN, Post -op Vital signs reviewed and stable and Patient moving all extremities  Post vital signs: Reviewed and stable  Last Vitals:  Vitals Value Taken Time  BP 113/61 01/02/20 1324  Temp    Pulse 82 01/02/20 1326  Resp 15 01/02/20 1326  SpO2 97 % 01/02/20 1326  Vitals shown include unvalidated device data.  Last Pain:  Vitals:   01/02/20 1110  TempSrc: Temporal  PainSc: 0-No pain         Complications: No complications documented.

## 2020-01-02 NOTE — Discharge Instructions (Addendum)
AMBULATORY SURGERY  °DISCHARGE INSTRUCTIONS ° ° °1) The drugs that you were given will stay in your system until tomorrow so for the next 24 hours you should not: ° °A) Drive an automobile °B) Make any legal decisions °C) Drink any alcoholic beverage ° ° °2) You may resume regular meals tomorrow.  Today it is better to start with liquids and gradually work up to solid foods. ° °You may eat anything you prefer, but it is better to start with liquids, then soup and crackers, and gradually work up to solid foods. ° ° °3) Please notify your doctor immediately if you have any unusual bleeding, trouble breathing, redness and pain at the surgery site, drainage, fever, or pain not relieved by medication. ° °Please contact your physician with any problems or Same Day Surgery at [phone], Monday through Friday 6 am to 4 pm, or [place] [facility] number at [phone]. °

## 2020-01-02 NOTE — Anesthesia Postprocedure Evaluation (Signed)
Anesthesia Post Note  Patient: Dennis Barrera  Procedure(s) Performed: HERNIA REPAIR INGUINAL ADULT (Left )  Patient location during evaluation: PACU Anesthesia Type: General Level of consciousness: awake and alert Pain management: pain level controlled Vital Signs Assessment: post-procedure vital signs reviewed and stable Respiratory status: spontaneous breathing and respiratory function stable Cardiovascular status: stable Anesthetic complications: no   No complications documented.   Last Vitals:  Vitals:   01/02/20 1404 01/02/20 1421  BP: (!) 148/82 (!) 147/71  Pulse: 66 71  Resp: 16 16  Temp: (!) 36.2 C   SpO2: 100% 99%    Last Pain:  Vitals:   01/02/20 1421  TempSrc:   PainSc: 0-No pain                 [name]

## 2020-01-02 NOTE — Anesthesia Preprocedure Evaluation (Signed)
Anesthesia Evaluation  Patient identified by MRN, date of birth, ID band Patient awake    Reviewed: Allergy & Precautions, NPO status , Patient's Chart, lab work & pertinent test results  History of Anesthesia Complications Negative for: history of anesthetic complications  Airway Mallampati: II       Dental   Pulmonary neg sleep apnea, neg COPD, Not current smoker,           Cardiovascular (-) hypertension(-) Past MI and (-) CHF (-) dysrhythmias (-) Valvular Problems/Murmurs     Neuro/Psych neg Seizures    GI/Hepatic Neg liver ROS, neg GERD  ,  Endo/Other  neg diabetes  Renal/GU negative Renal ROS     Musculoskeletal   Abdominal   Peds  Hematology   Anesthesia Other Findings   Reproductive/Obstetrics                             Anesthesia Physical Anesthesia Plan  ASA: II  Anesthesia Plan: General   Post-op Pain Management:    Induction: Intravenous  PONV Risk Score and Plan: 2 and Ondansetron and Dexamethasone  Airway Management Planned: LMA  Additional Equipment:   Intra-op Plan:   Post-operative Plan:   Informed Consent: I have reviewed the patients History and Physical, chart, labs and discussed the procedure including the risks, benefits and alternatives for the proposed anesthesia with the patient or authorized representative who has indicated his/her understanding and acceptance.       Plan Discussed with:   Anesthesia Plan Comments:         Anesthesia Quick Evaluation

## 2020-01-02 NOTE — H&P (Signed)
Dennis Barrera [id number] [birth year]     HPI:  Healthy 69 y/o male with a symptomatic left inguinal hernia. For elective repair.   Medications Prior to Admission  Medication Sig Dispense Refill Last Dose  . aspirin 81 MG tablet Take 81 mg by mouth daily.    12/28/2019  . meloxicam (MOBIC) 15 MG tablet Take 1 tablet (15 mg total) by mouth daily. (Patient not taking: Reported on 12/21/2019) 30 tablet 1 Not Taking at Unknown time  . sildenafil (REVATIO) 20 MG tablet TAKE ONE TABLET BY MOUTH DAILY AS NEEDED (Patient not taking: Reported on 10/15/2019) 25 tablet 11    No Known Allergies History reviewed. No pertinent past medical history. Past Surgical History:  Procedure Laterality Date  . COLONOSCOPY WITH PROPOFOL N/A 07/06/2016   Procedure: COLONOSCOPY WITH PROPOFOL;  Surgeon: [name], MD;  Location: [facility] ENDOSCOPY;  Service: Endoscopy;  Laterality: N/A;  . CYST EXCISION  02/05/14   lower back  . SHOULDER SURGERY    . TONSILLECTOMY    . WISDOM TOOTH EXTRACTION     Social History   Socioeconomic History  . Marital status: Married    Spouse name: Not on file  . Number of children: Not on file  . Years of education: Not on file  . Highest education level: Not on file  Occupational History  . Not on file  Tobacco Use  . Smoking status: Never Smoker  . Smokeless tobacco: Never Used  Substance and Sexual Activity  . Alcohol use: Yes    Comment: 1 drink a month  . Drug use: No  . Sexual activity: Not on file  Other Topics Concern  . Not on file  Social History Narrative  . Not on file   Social Determinants of Health   Financial Resource Strain:   . Difficulty of Paying Living Expenses: Not on file  Food Insecurity:   . Worried About Charity fundraiser in the Last Year: Not on file  . Ran Out of Food in the Last Year: Not on file  Transportation Needs:   . Lack of Transportation (Medical): Not on file  . Lack of Transportation (Non-Medical): Not on file  Physical  Activity:   . Days of Exercise per Week: Not on file  . Minutes of Exercise per Session: Not on file  Stress:   . Feeling of Stress : Not on file  Social Connections:   . Frequency of Communication with Friends and Family: Not on file  . Frequency of Social Gatherings with Friends and Family: Not on file  . Attends Religious Services: Not on file  . Active Member of Clubs or Organizations: Not on file  . Attends Archivist Meetings: Not on file  . Marital Status: Not on file  Intimate Partner Violence:   . Fear of Current or Ex-Partner: Not on file  . Emotionally Abused: Not on file  . Physically Abused: Not on file  . Sexually Abused: Not on file   Social History   Social History Narrative  . Not on file     ROS: Negative.     PE: HEENT: Negative. Lungs: Clear. Cardio: RR.  Assessment/Plan:  Proceed with planned left inguinal hernia repair.   Forest Gleason [facility] 01/02/2020

## 2020-01-04 NOTE — Op Note (Signed)
Preoperative diagnosis: Symptomatic left inguinal hernia.  Postoperative diagnosis: Same.  Operative procedure: Left inguinal hernia repair with medium Ultra Pro mesh.  Operating surgeon: Hervey Ard, MD.  Anesthesia: General by LMA, Marcaine 0.5% with 1: 200,000 units of epinephrine; Toradol: 30 mg.  Estimated blood loss: Less than 5 cc.  Clinical note: This 69 year old male developed symptomatic left inguinal hernia.  He was mated for elective repair.  He received Ancef prior to the procedure.  SCD stockings for DVT prevention.  Hair was removed from the surgical site prior to presentation to the operating theater.  Operative note: With the patient under adequate general anesthesia the abdomen was cleansed with ChloraPrep and draped.  A 5 cm skin line incision along the anticipated course of the inguinal canal was carried down through skin and subcutaneous tissue after field block anesthesia was established.  The skin was incised sharply and the remaining dissection completed with electrocautery.  The external oblique was opened in the direction of its fibers and the ilioinguinal nerve identified.  The ileal hypogastric nerve was not appreciated.  The cord structures were dissected and the indirect sac was freed circumferentially into the preperitoneal space.  A medium Ultra Pro mesh was smoothed into position in the preperitoneal pocket.  The external component was smooth along the floor the inguinal canal.  This was anchored to the pubic tubercle and along the inguinal ligament with interrupted 0 Surgilon sutures.  Similar sutures were used for the medial and superior aspects into the transverse abdominis aponeurosis.  A lateral slit was made for cord passage.  The wound was infiltrated with Marcaine and Toradol and the external oblique closed with a running 2-0 Vicryl suture.  Scarpa's fascia was closed with a running 3-0 Vicryl suture.  The skin was closed with a running 4-0 Vicryl  subcuticular suture.  Benzoin, Steri-Strips, Telfa and Tegaderm dressing was applied.  Patient tolerated the procedure well was taken to the recovery room in stable condition.

## 2020-03-04 ENCOUNTER — Other Ambulatory Visit: Payer: Self-pay

## 2020-03-04 ENCOUNTER — Ambulatory Visit (INDEPENDENT_AMBULATORY_CARE_PROVIDER_SITE_OTHER): Payer: 59 | Admitting: Family Medicine

## 2020-03-04 ENCOUNTER — Encounter: Payer: Self-pay | Admitting: Family Medicine

## 2020-03-04 VITALS — BP 133/74 | HR 92 | Temp 99.0°F | Resp 16 | Ht 73.0 in | Wt 206.0 lb

## 2020-03-04 DIAGNOSIS — M25562 Pain in left knee: Secondary | ICD-10-CM

## 2020-03-04 NOTE — Progress Notes (Signed)
I,[name],acting as a scribe for [name], MD.,have documented all relevant documentation on the behalf of [name], MD,as directed by  [name], MD while in the presence of [name], MD.   Established patient visit   Patient: Dennis Barrera   DOB: 18-Aug-1950   69 y.o. Male  MRN: [id number] Visit Date: 03/04/2020  Today's healthcare provider: [name], MD   Chief Complaint  Patient presents with  . Knee Pain   Subjective    Patient   Knee Pain  The incident occurred 5 to 7 days ago. There was no injury mechanism. The pain is present in the left knee. The quality of the pain is described as stabbing. The pain is at a severity of 10/10. The pain is severe. The pain has been fluctuating since onset. Associated symptoms include an inability to bear weight. Pertinent negatives include no loss of motion, loss of sensation, muscle weakness, numbness or tingling. He reports no foreign bodies present. The symptoms are aggravated by weight bearing. He has tried ice and immobilization for the symptoms.    Patient has had swelling and pain in his left knee for 5 days ago. Patient states his knee went out yesterday. Patient states he has been using a knee brace and ice for treatment.       Medications: Outpatient Medications Prior to Visit  Medication Sig  . aspirin 81 MG tablet Take 81 mg by mouth daily.   Marland Kitchen HYDROcodone-acetaminophen (NORCO/VICODIN) 5-325 MG tablet Take 1 tablet by mouth every 4 (four) hours as needed for moderate pain.  . sildenafil (REVATIO) 20 MG tablet TAKE ONE TABLET BY MOUTH DAILY AS NEEDED (Patient not taking: Reported on 10/15/2019)   No facility-administered medications prior to visit.    Review of Systems  Constitutional: Negative for appetite change, chills and fever.  Respiratory: Negative for chest tightness, shortness of breath and wheezing.   Cardiovascular: Negative for chest pain and  palpitations.  Gastrointestinal: Negative for abdominal pain, nausea and vomiting.  Musculoskeletal: Positive for arthralgias.  Neurological: Negative for tingling and numbness.       Objective    BP 133/74 (BP Location: Right Arm, Patient Position: Sitting, Cuff Size: Large)   Pulse 92   Temp 99 F (37.2 C) (Oral)   Resp 16   Ht 6\' 1"  (1.854 m)   Wt 206 lb (93.4 kg)   SpO2 97%   BMI 27.18 kg/m     Physical Exam Vitals reviewed.  Musculoskeletal:        General: Swelling present.     Comments: He is unable to bear weight on the left knee due to pain.  He has mild effusion.  Exam otherwise normal.  Neurological:     General: No focal deficit present.     Mental Status: He is alert and oriented to person, place, and time.  Psychiatric:        Mood and Affect: Mood normal.        Behavior: Behavior normal.        Thought Content: Thought content normal.        Judgment: Judgment normal.       No results found for any visits on 03/04/20.  Assessment & Plan     1. Acute pain of left knee Patient had mild arthritic changes 10 years ago on x-ray.  This is a bony problem or a meniscus problem most likely.  Due to this significant  pain patient has pain with weightbearing we will have him go to [facility] walk-in center.   No follow-ups on file.         Jaise Moser Cranford Mon, MD  [facility] [phone] (phone) [phone] (fax)  [place]

## 2020-05-06 DIAGNOSIS — M25562 Pain in left knee: Secondary | ICD-10-CM | POA: Diagnosis not present

## 2020-05-07 ENCOUNTER — Other Ambulatory Visit: Payer: Self-pay | Admitting: Orthopedic Surgery

## 2020-05-07 DIAGNOSIS — M25562 Pain in left knee: Secondary | ICD-10-CM

## 2020-05-13 ENCOUNTER — Encounter: Payer: Self-pay | Admitting: Family Medicine

## 2020-05-13 ENCOUNTER — Other Ambulatory Visit: Payer: Medicare HMO

## 2020-05-13 DIAGNOSIS — Z20822 Contact with and (suspected) exposure to covid-19: Secondary | ICD-10-CM | POA: Diagnosis not present

## 2020-05-14 ENCOUNTER — Ambulatory Visit: Admission: RE | Admit: 2020-05-14 | Payer: Medicare HMO | Source: Ambulatory Visit

## 2020-05-14 ENCOUNTER — Other Ambulatory Visit: Payer: Medicare HMO

## 2020-05-16 LAB — NOVEL CORONAVIRUS, NAA: SARS-CoV-2, NAA: DETECTED — AB

## 2020-05-23 ENCOUNTER — Ambulatory Visit: Payer: Medicare HMO

## 2020-05-27 ENCOUNTER — Ambulatory Visit
Admission: RE | Admit: 2020-05-27 | Discharge: 2020-05-27 | Disposition: A | Discharge status: Home / Self Care | Payer: Medicare HMO | Source: Ambulatory Visit | Attending: Orthopedic Surgery | Admitting: Orthopedic Surgery

## 2020-05-27 ENCOUNTER — Other Ambulatory Visit: Payer: Self-pay

## 2020-05-27 DIAGNOSIS — M25562 Pain in left knee: Secondary | ICD-10-CM | POA: Insufficient documentation

## 2020-05-29 DIAGNOSIS — M2392 Unspecified internal derangement of left knee: Secondary | ICD-10-CM | POA: Diagnosis not present

## 2020-06-08 NOTE — H&P (Signed)
ORTHOPAEDIC HISTORY & PHYSICAL Loyde Orth, Florinda Marker., MD - 05/29/2020 4:00 PM EST Formatting of this note is different from the original. Images from the original note were not included. Chief Complaint: Chief Complaint  Patient presents with  . Knee Pain  Left knee MRI 05/14/20 results   Reason for Visit: The patient is a 70 y.o. male who presents today for evaluation of his left knee. He reports a 2 month(s) history of left knee pain without any specific trauma or aggravating event. He localizes most of the pain along the medial aspect of the knee. He reports some swelling, some locking, and some giving way of the knee. He initially had difficulty bearing weight on the left knee due to the severity of the pain. The pain is aggravated by any weight bearing, lateral movements and pivoting. He used ice, Tylenol, and activity modification without much benefit. He was initially evaluated by Dr. Earnestine Leys and received an intra-articular corticosteroid injection to the left knee. He was referred to physical therapy and placed in a knee brace. Although he has noticed some improvement of the symptoms, the pain has recurred with walking on uneven surfaces. He is not using any ambulatory aids but does tend to walk with a limp.   Medications: Current Outpatient Medications  Medication Sig Dispense Refill  . ibuprofen (MOTRIN) 200 MG tablet Take 200 mg by mouth every 8 (eight) hours as needed for Pain   No current facility-administered medications for this visit.   Allergies: No Known Allergies  Past Medical History: Past Medical History:  Diagnosis Date  . Chickenpox   Past Surgical History: Past Surgical History:  Procedure Laterality Date  . COLONOSCOPY 07/06/2016  Dr. Vicente Males  . INGUINAL HERNIA REPAIR Left 01/02/2020  . lower back cyst excision 02/05/2014  . shoulder surgery  . TONSILLECTOMY  . wisdom tooth extraction   Social History: Social History   Socioeconomic History  .  Marital status: Married  Spouse name: Jenny Reichmann  . Number of children: 1  . Years of education: 25  . Highest education level: Bachelor's degree (e.g., BA, AB, BS)  Occupational History  . Occupation: Retired  Tobacco Use  . Smoking status: Former Smoker  Packs/day: 1.00  Years: 10.00  Pack years: 10.00  Types: Cigarettes  Quit date: 05/03/1978  Years since quitting: 42.1  . Smokeless tobacco: Never Used  Substance and Sexual Activity  . Alcohol use: Yes  Comment: occassional, <1  . Drug use: Never  . Sexual activity: Yes  Partners: Female  Other Topics Concern  . Not on file  Social History Narrative  . Not on file   Social Determinants of Health   Financial Resource Strain: Not on file  Food Insecurity: Not on file  Transportation Needs: Not on file  Physical Activity: Not on file  Stress: Not on file  Social Connections: Not on file  Housing Stability: Not on file   Family History: Family History  Problem Relation Age of Onset  . High blood pressure (Hypertension) Mother  . Stroke Father  . Osteoarthritis Father  . High blood pressure (Hypertension) Brother  . Heart disease Maternal Grandfather  . Colon cancer Neg Hx   Review of Systems: A comprehensive 14 point ROS was performed, reviewed, and the pertinent orthopaedic findings are documented in the HPI.  Exam BP (!) 170/98  Temp 36.5 C (97.7 F)  Ht 185.4 cm (6\' 1" )  Wt 90.8 kg (200 lb 3.2 oz)  BMI 26.41 kg/m  General:  Well-developed, well-nourished male seen in no acute distress.  Antalgic gait.  No varus or valgus thrust to the left knee.  HEENT:  Atraumatic, normocephalic. Pupils are equal and reactive to light. Extraocular motion is intact. Sclera are clear. Oropharynx is clear with moist mucosa.  Lungs:  Clear to auscultation bilaterally.  Cardiovascular: Regular rate and rhythm. Normal S1, S2. No murmur . No appreciable gallops or rubs. Peripheral pulses are palpable. No lower extremity  edema. Homan`s test is negative.   Extremities: Good strength, stability, and range of motion of the upper extremities. Good range of motion of the hips and ankles.  Left Knee:  Soft tissue swelling: minimal Effusion: none Erythema: none Crepitance: minimal Tenderness: medial Alignment: normal Mediolateral laxity: stable Anterior drawer test:negative Lachman`s test: negative McMurray`s test: equivocal Atrophy: No significant atrophy.  Quadriceps tone was fair to good. Range of Motion: 0/0/130 degrees  Neurologic:  Awake, alert, and oriented.  Sensory function is intact to pinprick and light touch.  Motor strength is judged to be 5/5.  Motor coordination is within normal limits.  No apparent clonus. No tremor.   MRI: I reviewed the left knee MRI from [facility] dated 05/27/2020. I concur with the radiologist's interpretation as below:  MRI OF THE LEFT KNEE WITHOUT CONTRAST   TECHNIQUE:  Multiplanar, multisequence MR imaging of the knee was performed. No  intravenous contrast was administered.   COMPARISON: None.   FINDINGS:  MENISCI   Medial: Severe complex tear of the posterior horn of the medial  meniscus extending into the body with peripheral meniscal extrusion.   Lateral: Intact.   LIGAMENTS   Cruciates: ACL and PCL are intact.   Collaterals: Medial collateral ligament is intact. Lateral  collateral ligament complex is intact.   CARTILAGE   Patellofemoral: High-grade partial-thickness cartilage loss of the  lateral patellofemoral compartment with areas of full-thickness  cartilage loss. Mild partial-thickness cartilage loss of the medial  femorotibial compartment.   Medial: Full-thickness cartilage loss of the medial femorotibial  compartment with subchondral marrow edema.   Lateral: Mild partial-thickness cartilage loss of the lateral  femorotibial compartment.   JOINT: Small joint effusion. Normal Hoffa's fat-pad. No  plical  thickening. Multiple loose bodies posterior to the PCL with the  largest measuring 2.7 cm.   POPLITEAL FOSSA: Popliteus tendon is intact. No Baker's cyst. 2 cm  loose body in the popliteus tendon sheath.   EXTENSOR MECHANISM: Intact quadriceps tendon. Intact patellar  tendon. Intact lateral patellar retinaculum. Intact medial patellar  retinaculum. Intact MPFL.   BONES: No aggressive osseous lesion. Subchondral linear low signal  in the medial femoral condyle with severe surrounding marrow edema  concerning for a nondisplaced, nondepressed insufficiency fracture  (image 12/series 13.   Other: No fluid collection or hematoma. Muscles are normal.   IMPRESSION:  1. Severe complex tear of the posterior horn of the medial meniscus  extending into the body with peripheral meniscal extrusion.  2. Tricompartmental cartilage abnormalities as described above most  severe in the medial femorotibial compartment.  3. Subchondral linear low signal in the medial femoral condyle with  severe surrounding marrow edema concerning for a nondisplaced,  nondepressed insufficiency fracture (image 12/series 13.   Electronically Signed  By: Kathreen Devoid  On: 05/27/2020 10:55  Impression: Internal derangement of the left knee  Plan:  The findings were discussed in detail with the patient. The patient was given informational material on knee arthroscopy. Conservative treatment options were reviewed with the  patient. We discussed the risks and benefits of surgical intervention. Arthroscopy is an appropriate treatment for the meniscal pathology, but would have limited or no effect on degenerative changes of the articular cartilage. The usual perioperative course was also discussed in detail. The patient expressed understanding of the risks and benefits of surgical intervention and would like to proceed with plans for left knee arthroscopy.  MEDICAL CLEARANCE: Per anesthesiology. ACTIVITIES:   Avoid pivoting, squatting, or twisting. WORK STATUS: Not applicable. THERAPY: Quadriceps strengthening exercises. MEDICATIONS: Requested Prescriptions   No prescriptions requested or ordered in this encounter   FOLLOW-UP: Return for preop History & Physical pending surgery date.   Colandra Ohanian P. Holley Bouche., M.D.  This note was generated in part with voice recognition software and I apologize for any typographical errors that were not detected and corrected.   Electronically signed by Lamar Benes., MD at 05/31/2020 10:23 PM EST

## 2020-06-11 ENCOUNTER — Encounter
Admission: RE | Admit: 2020-06-11 | Discharge: 2020-06-11 | Disposition: A | Discharge status: Home / Self Care | Payer: Medicare HMO | Source: Ambulatory Visit | Attending: Orthopedic Surgery | Admitting: Orthopedic Surgery

## 2020-06-11 ENCOUNTER — Other Ambulatory Visit: Payer: Self-pay

## 2020-06-11 NOTE — Patient Instructions (Signed)
Your procedure is scheduled on: 06/16/20 Report to [facility]. To find out your arrival time please call [phone] between 1PM - 3PM on 06/13/20.  Remember: Instructions that are not followed completely may result in serious medical risk, up to and including death, or upon the discretion of your surgeon and anesthesiologist your surgery may need to be rescheduled.     _X__ 1. Do not eat food after midnight the night before your procedure.                 No gum chewing or hard candies. You may drink clear liquids up to 2 hours                 before you are scheduled to arrive for your surgery- DO not drink clear                 liquids within 2 hours of the start of your surgery.                 Clear Liquids include:  water, apple juice without pulp, clear carbohydrate                 drink such as Clearfast or Gatorade, Black Coffee or Tea (Do not add                 anything to coffee or tea). Diabetics water only  __X__2.  On the morning of surgery brush your teeth with toothpaste and water, you                 may rinse your mouth with mouthwash if you wish.  Do not swallow any              toothpaste of mouthwash.     _X__ 3.  No Alcohol for 24 hours before or after surgery.   _X__ 4.  Do Not Smoke or use e-cigarettes For 24 Hours Prior to Your Surgery.                 Do not use any chewable tobacco products for at least 6 hours prior to                 surgery.  ____  5.  Bring all medications with you on the day of surgery if instructed.   __X__  6.  Notify your doctor if there is any change in your medical condition      (cold, fever, infections).     Do not wear jewelry, make-up, hairpins, clips or nail polish. Do not wear lotions, powders, or perfumes.  Do not shave 48 hours prior to surgery. Men may shave face and neck. Do not bring valuables to the hospital.    [facility] is not responsible for any belongings or  valuables.  Contacts, dentures/partials or body piercings may not be worn into surgery. Bring a case for your contacts, glasses or hearing aids, a denture cup will be supplied. Leave your suitcase in the car. After surgery it may be brought to your room. For patients admitted to the hospital, discharge time is determined by your treatment team.   Patients discharged the day of surgery will not be allowed to drive home.   Please read over the following fact sheets that you were given:   MRSA Information, CHG SOAP  __X__ Take these medicines the morning of surgery with A SIP OF WATER:  1. NONE  2.   3.   4.  5.  6.  ____ Fleet Enema (as directed)   __X__ Use CHG Soap/SAGE wipes as directed  ____ Use inhalers on the day of surgery  ____ Stop metformin/Janumet/Farxiga 2 days prior to surgery    ____ Take 1/2 of usual insulin dose the night before surgery. No insulin the morning          of surgery.   ____ Stop Blood Thinners Coumadin/Plavix/Xarelto/Pleta/Pradaxa/Eliquis/Effient/Aspirin  on   Or contact your Surgeon, Cardiologist or Medical Doctor regarding  ability to stop your blood thinners  __X__ Stop Anti-inflammatories 7 days before surgery such as Advil, Ibuprofen, Motrin,  BC or Goodies Powder, Naprosyn, Naproxen, Aleve, Aspirin    __X__ Stop all herbal supplements, fish oil or vitamin E until after surgery.    ____ Bring C-Pap to the hospital.    THE ENSURE PRE SURGERY DRINK NEEDS TO BE FINISHED 2 HOURS BEFORE YOUR SCHEDULED ARRIVAL TIME. YOU MAY DRINK THE OTHER CLEAR LIQUIDS AS DISCUSSED UNTIL THAT TIME

## 2020-06-15 ENCOUNTER — Encounter: Payer: Self-pay | Admitting: Orthopedic Surgery

## 2020-06-16 ENCOUNTER — Ambulatory Visit
Admission: RE | Admit: 2020-06-16 | Discharge: 2020-06-16 | Disposition: A | Discharge status: Home / Self Care | Payer: Medicare HMO | Attending: Orthopedic Surgery | Admitting: Orthopedic Surgery

## 2020-06-16 ENCOUNTER — Other Ambulatory Visit: Payer: Self-pay

## 2020-06-16 ENCOUNTER — Encounter
Admission: RE | Disposition: A | Discharge status: Home / Self Care | Payer: Self-pay | Source: Home / Self Care | Attending: Orthopedic Surgery

## 2020-06-16 ENCOUNTER — Ambulatory Visit: Payer: Medicare HMO | Admitting: Certified Registered"

## 2020-06-16 ENCOUNTER — Encounter: Payer: Self-pay | Admitting: Orthopedic Surgery

## 2020-06-16 DIAGNOSIS — M94262 Chondromalacia, left knee: Secondary | ICD-10-CM | POA: Diagnosis not present

## 2020-06-16 DIAGNOSIS — M2242 Chondromalacia patellae, left knee: Secondary | ICD-10-CM | POA: Diagnosis not present

## 2020-06-16 DIAGNOSIS — M23222 Derangement of posterior horn of medial meniscus due to old tear or injury, left knee: Secondary | ICD-10-CM | POA: Diagnosis not present

## 2020-06-16 DIAGNOSIS — Z9889 Other specified postprocedural states: Secondary | ICD-10-CM

## 2020-06-16 DIAGNOSIS — X58XXXA Exposure to other specified factors, initial encounter: Secondary | ICD-10-CM | POA: Diagnosis not present

## 2020-06-16 DIAGNOSIS — S83232A Complex tear of medial meniscus, current injury, left knee, initial encounter: Secondary | ICD-10-CM | POA: Insufficient documentation

## 2020-06-16 DIAGNOSIS — M94252 Chondromalacia, left hip: Secondary | ICD-10-CM | POA: Diagnosis not present

## 2020-06-16 DIAGNOSIS — Z87891 Personal history of nicotine dependence: Secondary | ICD-10-CM | POA: Insufficient documentation

## 2020-06-16 DIAGNOSIS — M2392 Unspecified internal derangement of left knee: Secondary | ICD-10-CM | POA: Diagnosis not present

## 2020-06-16 HISTORY — PX: KNEE ARTHROSCOPY: SHX127

## 2020-06-16 SURGERY — ARTHROSCOPY, KNEE
Anesthesia: General | Site: Knee | Laterality: Left

## 2020-06-16 MED ORDER — HYDROCODONE-ACETAMINOPHEN 5-325 MG PO TABS: 1.0000 | ORAL_TABLET | ORAL | Status: DC | PRN

## 2020-06-16 MED ORDER — FENTANYL CITRATE (PF) 100 MCG/2ML IJ SOLN
INTRAMUSCULAR | Status: AC
  Filled 2020-06-16: qty 2

## 2020-06-16 MED ORDER — HYDROCODONE-ACETAMINOPHEN 7.5-325 MG PO TABS
1.0000 | ORAL_TABLET | ORAL | Status: DC | PRN
  Filled 2020-06-16 (×2): qty 2

## 2020-06-16 MED ORDER — MORPHINE SULFATE (PF) 4 MG/ML IV SOLN
INTRAVENOUS | Status: AC
  Filled 2020-06-16: qty 1

## 2020-06-16 MED ORDER — ONDANSETRON HCL 4 MG/2ML IJ SOLN
INTRAMUSCULAR | Status: DC | PRN
  Administered 2020-06-16: 4 mg via INTRAVENOUS

## 2020-06-16 MED ORDER — BUPIVACAINE-EPINEPHRINE 0.25% -1:200000 IJ SOLN
INTRAMUSCULAR | Status: DC | PRN
  Administered 2020-06-16: 25 mL
  Administered 2020-06-16: 5 mL

## 2020-06-16 MED ORDER — FAMOTIDINE 20 MG PO TABS: 20.0000 mg | ORAL_TABLET | Freq: Once | ORAL | Status: AC

## 2020-06-16 MED ORDER — MORPHINE SULFATE (PF) 2 MG/ML IV SOLN: 0.5000 mg | INTRAVENOUS | Status: DC | PRN

## 2020-06-16 MED ORDER — CHLORHEXIDINE GLUCONATE 0.12 % MT SOLN: 15.0000 mL | Freq: Once | OROMUCOSAL | Status: AC

## 2020-06-16 MED ORDER — CHLORHEXIDINE GLUCONATE 0.12 % MT SOLN
OROMUCOSAL | Status: AC
  Administered 2020-06-16: 15 mL via OROMUCOSAL
  Filled 2020-06-16: qty 15

## 2020-06-16 MED ORDER — DEXAMETHASONE SODIUM PHOSPHATE 10 MG/ML IJ SOLN
INTRAMUSCULAR | Status: DC | PRN
  Administered 2020-06-16: 5 mg via INTRAVENOUS

## 2020-06-16 MED ORDER — FENTANYL CITRATE (PF) 100 MCG/2ML IJ SOLN: 25.0000 ug | INTRAMUSCULAR | Status: DC | PRN

## 2020-06-16 MED ORDER — ACETAMINOPHEN 325 MG PO TABS
325.0000 mg | ORAL_TABLET | Freq: Four times a day (QID) | ORAL | Status: DC | PRN
Start: 2020-06-17 — End: 2020-06-16

## 2020-06-16 MED ORDER — HYDROCODONE-ACETAMINOPHEN 7.5-325 MG PO TABS
ORAL_TABLET | ORAL | Status: AC
  Administered 2020-06-16: 1 via ORAL
  Filled 2020-06-16: qty 1

## 2020-06-16 MED ORDER — HYDROCODONE-ACETAMINOPHEN 5-325 MG PO TABS
1.0000 | ORAL_TABLET | ORAL | 0 refills | Status: DC | PRN
Start: 2020-06-16 — End: 2020-11-18

## 2020-06-16 MED ORDER — ONDANSETRON HCL 4 MG PO TABS: 4.0000 mg | ORAL_TABLET | Freq: Four times a day (QID) | ORAL | Status: DC | PRN

## 2020-06-16 MED ORDER — CELECOXIB 200 MG PO CAPS: 400.0000 mg | ORAL_CAPSULE | Freq: Once | ORAL | Status: AC

## 2020-06-16 MED ORDER — BUPIVACAINE HCL (PF) 0.25 % IJ SOLN
INTRAMUSCULAR | Status: AC
  Filled 2020-06-16: qty 30

## 2020-06-16 MED ORDER — LACTATED RINGERS IV SOLN: INTRAVENOUS | Status: DC

## 2020-06-16 MED ORDER — FAMOTIDINE 20 MG PO TABS
ORAL_TABLET | ORAL | Status: AC
  Administered 2020-06-16: 20 mg via ORAL
  Filled 2020-06-16: qty 1

## 2020-06-16 MED ORDER — SODIUM CHLORIDE 0.9 % IV SOLN: INTRAVENOUS | Status: DC

## 2020-06-16 MED ORDER — METOCLOPRAMIDE HCL 10 MG PO TABS: 5.0000 mg | ORAL_TABLET | Freq: Three times a day (TID) | ORAL | Status: DC | PRN

## 2020-06-16 MED ORDER — ORAL CARE MOUTH RINSE: 15.0000 mL | Freq: Once | OROMUCOSAL | Status: AC

## 2020-06-16 MED ORDER — ACETAMINOPHEN 10 MG/ML IV SOLN
INTRAVENOUS | Status: AC
  Filled 2020-06-16: qty 100

## 2020-06-16 MED ORDER — ACETAMINOPHEN 10 MG/ML IV SOLN
INTRAVENOUS | Status: DC | PRN
  Administered 2020-06-16: 1000 mg via INTRAVENOUS

## 2020-06-16 MED ORDER — ONDANSETRON HCL 4 MG/2ML IJ SOLN: 4.0000 mg | Freq: Four times a day (QID) | INTRAMUSCULAR | Status: DC | PRN

## 2020-06-16 MED ORDER — METOCLOPRAMIDE HCL 5 MG/ML IJ SOLN: 5.0000 mg | Freq: Three times a day (TID) | INTRAMUSCULAR | Status: DC | PRN

## 2020-06-16 MED ORDER — FENTANYL CITRATE (PF) 100 MCG/2ML IJ SOLN
INTRAMUSCULAR | Status: DC | PRN
  Administered 2020-06-16: 25 ug via INTRAVENOUS
  Administered 2020-06-16: 50 ug via INTRAVENOUS
  Administered 2020-06-16: 25 ug via INTRAVENOUS

## 2020-06-16 MED ORDER — MORPHINE SULFATE 4 MG/ML IJ SOLN
INTRAMUSCULAR | Status: DC | PRN
  Administered 2020-06-16: 4 mg via INTRAVENOUS

## 2020-06-16 MED ORDER — CELECOXIB 200 MG PO CAPS
ORAL_CAPSULE | ORAL | Status: AC
  Administered 2020-06-16: 400 mg via ORAL
  Filled 2020-06-16: qty 2

## 2020-06-16 MED ORDER — ONDANSETRON HCL 4 MG/2ML IJ SOLN: 4.0000 mg | Freq: Once | INTRAMUSCULAR | Status: DC | PRN

## 2020-06-16 MED ORDER — PROPOFOL 10 MG/ML IV BOLUS
INTRAVENOUS | Status: DC | PRN
  Administered 2020-06-16: 200 mg via INTRAVENOUS

## 2020-06-16 SURGICAL SUPPLY — 33 items
ADAPTER IRRIG TUBE 2 SPIKE SOL (ADAPTER) ×4 IMPLANT
ADPR TBG 2 SPK PMP STRL ASCP (ADAPTER) ×2
BLADE SHAVER 4.5 DBL SERAT CV (CUTTER) ×4 IMPLANT
COVER WAND RF STERILE (DRAPES) ×2 IMPLANT
CUFF TOURN SGL QUICK 24 (TOURNIQUET CUFF) ×2
CUFF TOURN SGL QUICK 30 (TOURNIQUET CUFF)
CUFF TRNQT CYL 24X4X16.5-23 (TOURNIQUET CUFF) ×1 IMPLANT
CUFF TRNQT CYL 30X4X21-28X (TOURNIQUET CUFF) IMPLANT
DRAPE ARTHRO LIMB 89X125 STRL (DRAPES) ×2 IMPLANT
DRSG DERMACEA 8X12 NADH (GAUZE/BANDAGES/DRESSINGS) ×2 IMPLANT
DURAPREP 26ML APPLICATOR (WOUND CARE) ×4 IMPLANT
GAUZE SPONGE 4X4 12PLY STRL (GAUZE/BANDAGES/DRESSINGS) ×2 IMPLANT
GLOVE INDICATOR 8.0 STRL GRN (GLOVE) ×2 IMPLANT
GLOVE SURG ENC TEXT LTX SZ7.5 (GLOVE) ×2 IMPLANT
GOWN STRL REUS W/ TWL LRG LVL3 (GOWN DISPOSABLE) ×2 IMPLANT
GOWN STRL REUS W/TWL LRG LVL3 (GOWN DISPOSABLE) ×4
IV LACTATED RINGER IRRG 3000ML (IV SOLUTION) ×12
IV LR IRRIG 3000ML ARTHROMATIC (IV SOLUTION) ×6 IMPLANT
KIT TURNOVER KIT A (KITS) ×2 IMPLANT
MANIFOLD NEPTUNE II (INSTRUMENTS) ×4 IMPLANT
PACK ARTHROSCOPY KNEE (MISCELLANEOUS) ×2 IMPLANT
PAD CAST 4YDX4 CTTN HI CHSV (CAST SUPPLIES) ×1 IMPLANT
PADDING CAST COTTON 4X4 STRL (CAST SUPPLIES) ×2
SET TUBE SUCT SHAVER OUTFL 24K (TUBING) ×2 IMPLANT
SET TUBE TIP INTRA-ARTICULAR (MISCELLANEOUS) ×2 IMPLANT
SOL PREP PVP 2OZ (MISCELLANEOUS) ×2
SOLUTION PREP PVP 2OZ (MISCELLANEOUS) ×1 IMPLANT
STOCKINETTE BIAS CUT 6 980064 (GAUZE/BANDAGES/DRESSINGS) ×2 IMPLANT
SUT ETHILON 3-0 FS-10 30 BLK (SUTURE) ×2
SUTURE EHLN 3-0 FS-10 30 BLK (SUTURE) ×1 IMPLANT
TUBING ARTHRO INFLOW-ONLY STRL (TUBING) ×2 IMPLANT
WAND HAND CNTRL MULTIVAC 50 (MISCELLANEOUS) ×2 IMPLANT
WRAP KNEE W/COLD PACKS 25.5X14 (SOFTGOODS) ×2 IMPLANT

## 2020-06-16 NOTE — Anesthesia Procedure Notes (Signed)
Procedure Name: LMA Insertion Performed by: Leshae Mcclay Ben, CRNA Pre-anesthesia Checklist: Patient identified, Emergency Drugs available, Suction available and Patient being monitored Patient Re-evaluated:Patient Re-evaluated prior to induction Oxygen Delivery Method: Circle system utilized Preoxygenation: Pre-oxygenation with 100% oxygen Induction Type: IV induction Ventilation: Mask ventilation without difficulty LMA: LMA inserted LMA Size: 4.0 Tube type: Oral Number of attempts: 1 Airway Equipment and Method: Oral airway Placement Confirmation: positive ETCO2 and breath sounds checked- equal and bilateral Tube secured with: Tape Dental Injury: Teeth and Oropharynx as per pre-operative assessment        

## 2020-06-16 NOTE — Discharge Instructions (Signed)
Instructions after Knee Arthroscopy    James P. Hooten, Jr., M.D.     Dept. of Orthopaedics & Sports Medicine  [facility]  [address]  [place], [place]  [address]   Phone: [phone]   Fax: [phone]   DIET: . Drink plenty of non-alcoholic fluids & begin a light diet. . Resume your normal diet the day after surgery.  ACTIVITY:  . You may use crutches or a walker with weight-bearing as tolerated, unless instructed otherwise. . You may wean yourself off of the walker or crutches as tolerated.  . Begin doing gentle exercises. Exercising will reduce the pain and swelling, increase motion, and prevent muscle weakness.   . Avoid strenuous activities or athletics for a minimum of 4-6 weeks after arthroscopic surgery. . Do not drive or operate any equipment until instructed.  WOUND CARE:  . Place one to two pillows under the knee the first day or two when sitting or lying.  . Continue to use the ice packs periodically to reduce pain and swelling. . The small incisions in your knee are closed with nylon stitches. The stitches will be removed in the office. . The bulky dressing may be removed on the second day after surgery. DO NOT TOUCH THE STITCHES. Put a Band-Aid over each stitch. Do NOT use any ointments or creams on the incisions.  . You may bathe or shower after the stitches are removed at the first office visit following surgery.  MEDICATIONS: . You may resume your regular medications. . Please take the pain medication as prescribed. . Do not take pain medication on an empty stomach. . Do not drive or drink alcoholic beverages when taking pain medications.  CALL THE OFFICE FOR: . Temperature above 101 degrees . Excessive bleeding or drainage on the dressing. . Excessive swelling, coldness, or paleness of the toes. . Persistent nausea and vomiting.  FOLLOW-UP:  . You should have an appointment to return to the office in 7-10 days after surgery.        [facility] Department Directory         [email or web address]       [email or web address]          Cardiology  Appointments: [place] - [phone] [place] - [phone]  Endocrinology  Appointments: [place] - [phone] [place] - [phone]  Gastroenterology  Appointments: [place] - [phone] [place] - [phone]        General Surgery   Appointments: [place] - [phone]  Internal Medicine/Family Medicine  Appointments: [place] - [phone] [place] - [phone] [place] - [phone]  Metabolic and Weigh Loss Surgery  Appointments: [place] - [phone]        Neurology  Appointments: [place] - [phone] [place] - [phone]  Neurosurgery  Appointments: [place] - [phone]  Obstetrics & Gynecology  Appointments: [place] - [phone] [place] - [phone]        Pediatrics  Appointments: [place] - [phone] [place] - [phone]  Physiatry  Appointments: [place] -[phone]  Physical Therapy  Appointments: [place] - [phone] [place] - [phone]        Podiatry  Appointments: [place] - [phone] [place] - [phone]  Pulmonology  Appointments: [place] - [phone]  Rheumatology  Appointments: [place] - [phone]        [place] Location: [facility]  [address] , [place]  [address]  [place] Location: [facility] [address] [place], [place]  [address]  [place] Location: [facility] [address] [place], [place]  [address]      AMBULATORY SURGERY    DISCHARGE INSTRUCTIONS   1) The drugs that you were given will stay in your system until tomorrow so for the next 24 hours you should not:  A) Drive an automobile B) Make any legal decisions C) Drink any alcoholic beverage   2) You may resume regular meals tomorrow.  Today it is better to start with liquids and gradually work up to  solid foods.  You may eat anything you prefer, but it is better to start with liquids, then soup and crackers, and gradually work up to solid foods.   3) Please notify your doctor immediately if you have any unusual bleeding, trouble breathing, redness and pain at the surgery site, drainage, fever, or pain not relieved by medication.    4) Additional Instructions:        Please contact your physician with any problems or Same Day Surgery at [phone], Monday through Friday 6 am to 4 pm, or [place] at [place] [facility] number at [phone]. 

## 2020-06-16 NOTE — Op Note (Signed)
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   ASSISTANT: none  ANESTHESIA: general  ESTIMATED BLOOD LOSS: Minimal  FLUIDS REPLACED: 700 mL of crystalloid  TOURNIQUET TIME: Not used  INDICATIONS FOR SURGERY: Dennis Barrera is a 70 y.o. year old male who has been seen for complaints of left knee pain. MRI demonstrated findings consistent with meniscal pathology. After discussion of the risks and benefits of surgical intervention, the patient expressed understanding of the risks benefits and agree with plans for left knee arthroscopy.   PROCEDURE IN DETAIL: The patient was brought into the operating room and, after adequate general anesthesia was achieved, a tourniquet was applied to the left thigh and the leg was placed in the leg holder. All bony prominences were well padded. The patient's left knee was cleaned and prepped with alcohol and Duraprep and draped in the usual sterile fashion. A "timeout" was performed as per usual protocol. The anticipated portal sites were injected with 0.25% Marcaine with epinephrine. An anterolateral incision was made and a cannula was inserted. A small effusion was evacuated and the knee was distended with fluid using the pump. The scope was advanced down the medial gutter into the medial compartment. Under visualization with the scope, an anteromedial portal was created and a hooked probe was inserted. The medial meniscus was visualized and probed.  There was a  complex tear of the posterior horn of the medial meniscus.  The tear was debrided using meniscal punches and a 4.5 mm incisor shaver.  Final contouring was performed using the 50 degree ArthroCare wand.  The remaining rim of meniscus was visualized and probed and felt be stable.  The articular cartilage was visualized.  There was an area of grade III chondromalacia involving the medial femoral condyle.  The lesion was debrided and contoured using the 50 degree ArthroCare wand.  The scope was then advanced into the intercondylar notch. The anterior cruciate ligament was visualized and probed and felt to be intact. The scope was removed from the lateral portal and reinserted via the anteromedial portal to better visualize the lateral compartment. The lateral meniscus was visualized and probed.  The lateral meniscus was intact without evidence of tear or instability.  The articular cartilage of the lateral compartment was visualized and noted to be in good condition.  Finally, the scope was advanced so as to visualize the patellofemoral articulation. Good patellar tracking was appreciated.  There was noted to be an area of grade IV chondromalacia involving the patella as well as the intercondylar sulcus.  The area was debrided and contoured using the ArthroCare wand.  The knee was irrigated with copius amounts of fluid and suctioned dry. The anterolateral portal was re-approximated with #3-0 nylon. A combination of 0.25% Marcaine with epinephrine and 4 mg of Morphine were injected via the scope. The scope was removed and the anteromedial portal was re-approximated with #3-0 nylon. A sterile dressing was applied followed by application of an ice wrap.  The patient tolerated the procedure well and was transported  to the PACU in stable condition.  Dennis Barrera., M.D.

## 2020-06-16 NOTE — Transfer of Care (Signed)
Immediate Anesthesia Transfer of Care Note  Patient: Dennis Barrera  Procedure(s) Performed: ARTHROSCOPY KNEE LEFT (Left Knee)  Patient Location: PACU  Anesthesia Type:General  Level of Consciousness: sedated and drowsy  Airway & Oxygen Therapy: Patient Spontanous Breathing and Patient connected to face mask oxygen  Post-op Assessment: Report given to RN  Post vital signs: Reviewed and stable  Last Vitals:  Vitals Value Taken Time  BP 144/81 06/16/20 1701  Temp    Pulse 76 06/16/20 1701  Resp 7 06/16/20 1701  SpO2 100 % 06/16/20 1701  Vitals shown include unvalidated device data.  Last Pain:  Vitals:   06/16/20 1415  TempSrc: Temporal         Complications: No complications documented.

## 2020-06-16 NOTE — Anesthesia Preprocedure Evaluation (Signed)
Anesthesia Evaluation  Patient identified by MRN, date of birth, ID band Patient awake    Reviewed: Allergy & Precautions, NPO status , Patient's Chart, lab work & pertinent test results  History of Anesthesia Complications Negative for: history of anesthetic complications  Airway Mallampati: II       Dental  (+) Teeth Intact   Pulmonary neg sleep apnea, neg COPD, Not current smoker, former smoker,    Pulmonary exam normal        Cardiovascular (-) hypertension(-) Past MI and (-) CHF negative cardio ROS Normal cardiovascular exam(-) dysrhythmias (-) Valvular Problems/Murmurs     Neuro/Psych neg Seizures  Neuromuscular disease    GI/Hepatic negative GI ROS, Neg liver ROS, neg GERD  ,  Endo/Other  neg diabetes  Renal/GU negative Renal ROS  negative genitourinary   Musculoskeletal  (+) Arthritis ,   Abdominal Normal abdominal exam  (+)   Peds negative pediatric ROS (+)  Hematology   Anesthesia Other Findings   Reproductive/Obstetrics                             Anesthesia Physical  Anesthesia Plan  ASA: II  Anesthesia Plan: General   Post-op Pain Management:    Induction: Intravenous  PONV Risk Score and Plan: 2 and Ondansetron and Dexamethasone  Airway Management Planned: LMA  Additional Equipment:   Intra-op Plan:   Post-operative Plan: Extubation in OR  Informed Consent: I have reviewed the patients History and Physical, chart, labs and discussed the procedure including the risks, benefits and alternatives for the proposed anesthesia with the patient or authorized representative who has indicated his/her understanding and acceptance.     Dental advisory given  Plan Discussed with: CRNA and Surgeon  Anesthesia Plan Comments:         Anesthesia Quick Evaluation

## 2020-06-17 ENCOUNTER — Encounter: Payer: Self-pay | Admitting: Orthopedic Surgery

## 2020-06-17 NOTE — Anesthesia Postprocedure Evaluation (Signed)
Anesthesia Post Note  Patient: Dennis Barrera  Procedure(s) Performed: ARTHROSCOPY KNEE LEFT (Left Knee)  Patient location during evaluation: PACU Anesthesia Type: General Level of consciousness: awake and alert and oriented Pain management: pain level controlled Vital Signs Assessment: post-procedure vital signs reviewed and stable Respiratory status: spontaneous breathing Cardiovascular status: blood pressure returned to baseline Anesthetic complications: no   No complications documented.   Last Vitals:  Vitals:   06/16/20 1731 06/16/20 1737  BP: (!) 144/91   Pulse: 87 83  Resp: 17 15  Temp: (!) 36.1 C   SpO2: 97% 98%    Last Pain:  Vitals:   06/16/20 1737  TempSrc:   PainSc: 3                  Trisha Morandi

## 2020-06-20 ENCOUNTER — Encounter: Payer: Self-pay | Admitting: Orthopedic Surgery

## 2020-08-29 ENCOUNTER — Ambulatory Visit: Payer: Self-pay | Admitting: *Deleted

## 2020-08-29 NOTE — Telephone Encounter (Signed)
Pt called in c/o deep congested cough, nasal congestion for a month that is not going away.  He is not sure if he has had fever or not maybe a little yesterday. He is coughing up occasional green mucus now.   Tried OTC medications which help temporarily but the symptoms return when the medication wears off.  He had covid in Jan. 2022.  Only mild symptoms with it and he recovered without any issues per pt.   These symptoms were not present when he had covid.   He has not done a covid test since these symptoms started.   There are no appts available at [facility] within the timeframe per the protocol.   I have referred him to the urgent care.   He is agreeable to going today.  I sent my notes to [facility] for Dr. Rosanna Randy his PCP.   Reason for Disposition . [1] Nasal discharge AND [2] present > 10 days    Productive cough, chest congestion and nasal congestion for a month not going away with OTC medications.  Answer Assessment - Initial Assessment Questions 1. ONSET: "When did the nasal discharge start?"      I have a deep cough, mucus for about 4 weeks now.   Runny nose too.   Some green mucus I'm coughing up.   Not tested for covid.    2. AMOUNT: "How much discharge is there?"      A month 3. COUGH: "Do you have a cough?" If yes, ask: "Describe the color of your sputum" (clear, white, yellow, green)     Yes with green mucus. Wed. I started using OTC decongestants which helped.  But the congestion and all not going away.    4. RESPIRATORY DISTRESS: "Describe your breathing."      I would have coughing fits but over wise it's fine.   Maybe a little shortness of breath.   5. FEVER: "Do you have a fever?" If Yes, ask: "What is your temperature, how was it measured, and when did it start?"     No body aches or chills.   Just felt a little warm yesterday.     I had covid a couple of months ago.  (Jan. 2022)  I had a headache and chills for a day and that's it.    I've had 2 vaccines and the booster for covid. 6. SEVERITY: "Overall, how bad are you feeling right now?" (e.g., doesn't interfere with normal activities, staying home from school/work, staying in bed)      Not bad I'm out taking my morning walk now in the neighborhood.   Just the deep rattle cough and congestion are not going away after a month and using OTC medication, decongestants.  The occasional green mucus I'm coughing up concerns me too. 7. OTHER SYMPTOMS: "Do you have any other symptoms?" (e.g., sore throat, earache, wheezing, vomiting)     No see above 8. PREGNANCY: "Is there any chance you are pregnant?" "When was your last menstrual period?"     N/A  Protocols used: COMMON COLD-A-AH

## 2020-11-18 ENCOUNTER — Ambulatory Visit (INDEPENDENT_AMBULATORY_CARE_PROVIDER_SITE_OTHER): Payer: Medicare HMO | Admitting: Family Medicine

## 2020-11-18 ENCOUNTER — Other Ambulatory Visit: Payer: Self-pay

## 2020-11-18 ENCOUNTER — Encounter: Payer: Self-pay | Admitting: Family Medicine

## 2020-11-18 VITALS — BP 123/80 | HR 69 | Resp 16 | Ht 73.0 in | Wt 200.0 lb

## 2020-11-18 DIAGNOSIS — E785 Hyperlipidemia, unspecified: Secondary | ICD-10-CM | POA: Diagnosis not present

## 2020-11-18 DIAGNOSIS — Z125 Encounter for screening for malignant neoplasm of prostate: Secondary | ICD-10-CM

## 2020-11-18 DIAGNOSIS — M25562 Pain in left knee: Secondary | ICD-10-CM | POA: Diagnosis not present

## 2020-11-18 DIAGNOSIS — Z13228 Encounter for screening for other metabolic disorders: Secondary | ICD-10-CM

## 2020-11-18 DIAGNOSIS — Z1329 Encounter for screening for other suspected endocrine disorder: Secondary | ICD-10-CM | POA: Diagnosis not present

## 2020-11-18 DIAGNOSIS — Z0000 Encounter for general adult medical examination without abnormal findings: Secondary | ICD-10-CM

## 2020-11-18 NOTE — Progress Notes (Signed)
I,Tomie Elko,acting as a scribe for [name], MD.,have documented all relevant documentation on the behalf of [name], MD,as directed by  [name], MD while in the presence of [name], MD.   Complete physical exam   Patient: Dennis Barrera   DOB: 1950-07-31   70 y.o. Male  MRN: [id number] Visit Date: 11/18/2020  Today's healthcare provider: [name], MD   Chief Complaint  Patient presents with   Medicare Wellness   Subjective    Dennis Barrera is a 70 y.o. male who presents today for a complete physical exam.  He reports consuming a general diet. Home exercise routine includes walking. He generally feels well. He reports sleeping well. He does not have additional problems to discuss today.  He is married.  His first wife died at age 69.  He has 1 daughter and 3 grandchildren to the daughter.  He is now retired and enjoying retirement.  History reviewed. No pertinent past medical history. Past Surgical History:  Procedure Laterality Date   COLONOSCOPY WITH PROPOFOL N/A 07/06/2016   Procedure: COLONOSCOPY WITH PROPOFOL;  Surgeon: [name], MD;  Location: [facility] ENDOSCOPY;  Service: Endoscopy;  Laterality: N/A;   CYST EXCISION  02/05/14   lower back   INGUINAL HERNIA REPAIR Left 01/02/2020   Procedure: HERNIA REPAIR INGUINAL ADULT;  Surgeon: Robert Bellow, MD;  Location: [facility] ORS;  Service: General;  Laterality: Left;   KNEE ARTHROSCOPY Left 06/16/2020   Procedure: ARTHROSCOPY KNEE LEFT;  Surgeon: Dereck Leep, MD;  Location: [facility] ORS;  Service: Orthopedics;  Laterality: Left;   SHOULDER SURGERY     TONSILLECTOMY     WISDOM TOOTH EXTRACTION     Social History   Socioeconomic History   Marital status: Married    Spouse name: Not on file   Number of children: Not on file   Years of education: Not on file   Highest education level: Not on file  Occupational History   Not on file  Tobacco Use   Smoking  status: Former    Years: 0.00    Types: Cigarettes   Smokeless tobacco: Never   Tobacco comments:    smoked for 10 years  Vaping Use   Vaping Use: Never used  Substance and Sexual Activity   Alcohol use: Yes    Comment: 1 drink a month   Drug use: No   Sexual activity: Not on file  Other Topics Concern   Not on file  Social History Narrative   ** Merged History Encounter **       Social Determinants of Health   Financial Resource Strain: Not on file  Food Insecurity: Not on file  Transportation Needs: Not on file  Physical Activity: Not on file  Stress: Not on file  Social Connections: Not on file  Intimate Partner Violence: Not on file   Family Status  Relation Name Status   Mother  Deceased   Father  Deceased   Sister  Alive   Brother  Deceased   MGF  Deceased at age 48's   Family History  Problem Relation Age of Onset   Hypertension Mother    Stroke Father    Osteoarthritis Father    Hypertension Brother    Heart disease Maternal Grandfather    No Known Allergies  Patient Care Team: Jerrol Banana., MD as PCP - General (Family Medicine) Jerrol Banana., MD (Family Medicine) Hervey Ard  W, MD (General Surgery)   Medications: Outpatient Medications Prior to Visit  Medication Sig   ibuprofen (ADVIL) 200 MG tablet Take 200 mg by mouth daily as needed for moderate pain.   [DISCONTINUED] Ascorbic Acid (VITAMIN C PO) Take 1 tablet by mouth daily.   [DISCONTINUED] HYDROcodone-acetaminophen (NORCO) 5-325 MG tablet Take 1-2 tablets by mouth every 4 (four) hours as needed for moderate pain.   No facility-administered medications prior to visit.    Review of Systems  All other systems reviewed and are negative.     Objective    BP 123/80 (BP Location: Right Arm, Patient Position: Sitting, Cuff Size: Large)   Pulse 69   Resp 16   Ht 6\' 1"  (1.854 m)   Wt 200 lb (90.7 kg)   SpO2 96%   BMI 26.39 kg/m  BP Readings from Last 3 Encounters:   11/18/20 123/80  06/16/20 (!) 144/91  03/04/20 133/74   Wt Readings from Last 3 Encounters:  11/18/20 200 lb (90.7 kg)  06/16/20 195 lb 15.8 oz (88.9 kg)  06/11/20 196 lb (88.9 kg)      Physical Exam Vitals and nursing note reviewed.  Constitutional:      Appearance: Normal appearance. He is normal weight.  HENT:     Right Ear: Tympanic membrane normal.     Left Ear: Tympanic membrane normal.     Nose: Nose normal.     Mouth/Throat:     Mouth: Mucous membranes are moist.  Cardiovascular:     Rate and Rhythm: Normal rate and regular rhythm.     Pulses: Normal pulses.     Heart sounds: Normal heart sounds.  Pulmonary:     Effort: Pulmonary effort is normal.     Breath sounds: Normal breath sounds.  Abdominal:     General: Bowel sounds are normal.     Palpations: Abdomen is soft.  Genitourinary:    Penis: Normal.      Testes: Normal.  Musculoskeletal:     Cervical back: Normal range of motion and neck supple.     Right lower leg: No edema.     Left lower leg: No edema.  Skin:    General: Skin is warm.  Neurological:     General: No focal deficit present.     Mental Status: He is alert and oriented to person, place, and time.  Psychiatric:        Mood and Affect: Mood normal.        Behavior: Behavior normal.        Thought Content: Thought content normal.        Judgment: Judgment normal.      Last depression screening scores PHQ 2/9 Scores 11/18/2020 11/14/2019 10/15/2019  PHQ - 2 Score 0 0 0  PHQ- 9 Score 0 0 -   Last fall risk screening Fall Risk  11/18/2020  Falls in the past year? 0  Number falls in past yr: 0  Injury with Fall? 0  Risk for fall due to : No Fall Risks  Follow up Falls evaluation completed   Last Audit-C alcohol use screening Alcohol Use Disorder Test (AUDIT) 11/18/2020  1. How often do you have a drink containing alcohol? 1  2. How many drinks containing alcohol do you have on a typical day when you are drinking? 0  3. How often do  you have six or more drinks on one occasion? 0  AUDIT-C Score 1  Alcohol Brief Interventions/Follow-up -   A  score of 3 or more in women, and 4 or more in men indicates increased risk for alcohol abuse, EXCEPT if all of the points are from question 1   No results found for any visits on 11/18/20.  Assessment & Plan    Routine Health Maintenance and Physical Exam  Exercise Activities and Dietary recommendations  Goals   None     Immunization History  Administered Date(s) Administered   Influenza-Unspecified 03/04/2019   Pneumococcal Polysaccharide-23 11/14/2019   Tdap 10/25/2014    Health Maintenance  Topic Date Due   COVID-19 Vaccine (1) Never done   Zoster Vaccines- Shingrix (1 of 2) Never done   PNA vac Low Risk Adult (2 of 2 - PCV13) 11/13/2020   INFLUENZA VACCINE  12/01/2020   TETANUS/TDAP  10/24/2024   COLONOSCOPY (Pts 45-35yrs Insurance coverage will need to be confirmed)  07/07/2026   Hepatitis C Screening  Completed   HPV VACCINES  Aged Out    Discussed health benefits of physical activity, and encouraged him to engage in regular exercise appropriate for his age and condition.  1. Annual physical exam  - CBC with Differential/Platelet - Comprehensive metabolic panel - Lipid panel - TSH  2. Hyperlipidemia, unspecified hyperlipidemia type  - CBC with Differential/Platelet - Comprehensive metabolic panel - Lipid panel - TSH  3. Acute pain of left knee  - CBC with Differential/Platelet - Comprehensive metabolic panel - Lipid panel - TSH  4. Prostate cancer screening  - PSA  5. Screening for metabolic disorder  - CBC with Differential/Platelet - Comprehensive metabolic panel - Lipid panel - TSH  6. Screening for thyroid disorder  - CBC with Differential/Platelet - Comprehensive metabolic panel - Lipid panel - TSH   Return in about 1 year (around 11/18/2021).     I, [name], MD, have reviewed all documentation for this  visit. The documentation on 11/23/20 for the exam, diagnosis, procedures, and orders are all accurate and complete.    Richard Cranford Mon, MD  [facility] [phone] (phone) [phone] (fax)  [place]

## 2020-11-18 NOTE — Patient Instructions (Signed)
Get Shingles Vaccine.

## 2020-11-26 DIAGNOSIS — Z1329 Encounter for screening for other suspected endocrine disorder: Secondary | ICD-10-CM | POA: Diagnosis not present

## 2020-11-26 DIAGNOSIS — Z13228 Encounter for screening for other metabolic disorders: Secondary | ICD-10-CM | POA: Diagnosis not present

## 2020-11-26 DIAGNOSIS — M25562 Pain in left knee: Secondary | ICD-10-CM | POA: Diagnosis not present

## 2020-11-26 DIAGNOSIS — Z125 Encounter for screening for malignant neoplasm of prostate: Secondary | ICD-10-CM | POA: Diagnosis not present

## 2020-11-26 DIAGNOSIS — Z0000 Encounter for general adult medical examination without abnormal findings: Secondary | ICD-10-CM | POA: Diagnosis not present

## 2020-11-26 DIAGNOSIS — E785 Hyperlipidemia, unspecified: Secondary | ICD-10-CM | POA: Diagnosis not present

## 2020-11-27 LAB — COMPREHENSIVE METABOLIC PANEL
ALT: 19 IU/L (ref 0–44)
AST: 21 IU/L (ref 0–40)
Albumin/Globulin Ratio: 1.7 (ref 1.2–2.2)
Albumin: 4 g/dL (ref 3.8–4.8)
Alkaline Phosphatase: 69 IU/L (ref 44–121)
BUN/Creatinine Ratio: 20 (ref 10–24)
BUN: 18 mg/dL (ref 8–27)
Bilirubin Total: 0.4 mg/dL (ref 0.0–1.2)
CO2: 23 mmol/L (ref 20–29)
Calcium: 9.7 mg/dL (ref 8.6–10.2)
Chloride: 104 mmol/L (ref 96–106)
Creatinine, Ser: 0.92 mg/dL (ref 0.76–1.27)
Globulin, Total: 2.4 g/dL (ref 1.5–4.5)
Glucose: 107 mg/dL — ABNORMAL HIGH (ref 65–99)
Potassium: 4.5 mmol/L (ref 3.5–5.2)
Sodium: 141 mmol/L (ref 134–144)
Total Protein: 6.4 g/dL (ref 6.0–8.5)
eGFR: 91 mL/min/{1.73_m2} (ref >59)

## 2020-11-27 LAB — CBC WITH DIFFERENTIAL/PLATELET
Basophils Absolute: 0.1 10*3/uL (ref 0.0–0.2)
Basos: 1 %
EOS (ABSOLUTE): 0.3 10*3/uL (ref 0.0–0.4)
Eos: 5 %
Hematocrit: 44.4 % (ref 37.5–51.0)
Hemoglobin: 14.6 g/dL (ref 13.0–17.7)
Immature Grans (Abs): 0 10*3/uL (ref 0.0–0.1)
Immature Granulocytes: 0 %
Lymphocytes Absolute: 1.9 10*3/uL (ref 0.7–3.1)
Lymphs: 30 %
MCH: 27.8 pg (ref 26.6–33.0)
MCHC: 32.9 g/dL (ref 31.5–35.7)
MCV: 85 fL (ref 79–97)
Monocytes Absolute: 0.6 10*3/uL (ref 0.1–0.9)
Monocytes: 9 %
Neutrophils Absolute: 3.5 10*3/uL (ref 1.4–7.0)
Neutrophils: 55 %
Platelets: 327 10*3/uL (ref 150–450)
RBC: 5.25 x10E6/uL (ref 4.14–5.80)
RDW: 12.8 % (ref 11.6–15.4)
WBC: 6.3 10*3/uL (ref 3.4–10.8)

## 2020-11-27 LAB — LIPID PANEL
Chol/HDL Ratio: 4.3 ratio (ref 0.0–5.0)
Cholesterol, Total: 208 mg/dL — ABNORMAL HIGH (ref 100–199)
HDL: 48 mg/dL (ref >39)
LDL Chol Calc (NIH): 134 mg/dL — ABNORMAL HIGH (ref 0–99)
Triglycerides: 148 mg/dL (ref 0–149)
VLDL Cholesterol Cal: 26 mg/dL (ref 5–40)

## 2020-11-27 LAB — PSA: Prostate Specific Ag, Serum: 1.3 ng/mL (ref 0.0–4.0)

## 2020-11-27 LAB — TSH: TSH: 1.88 u[IU]/mL (ref 0.450–4.500)

## 2021-02-03 DIAGNOSIS — H5203 Hypermetropia, bilateral: Secondary | ICD-10-CM | POA: Diagnosis not present

## 2021-04-03 ENCOUNTER — Other Ambulatory Visit: Payer: Self-pay

## 2021-04-03 NOTE — Progress Notes (Signed)
Pt completed pre-employment uds and HR notified./CL,RMA

## 2021-11-19 ENCOUNTER — Ambulatory Visit (INDEPENDENT_AMBULATORY_CARE_PROVIDER_SITE_OTHER): Payer: Medicare HMO | Admitting: Family Medicine

## 2021-11-19 ENCOUNTER — Encounter: Payer: Self-pay | Admitting: Family Medicine

## 2021-11-19 VITALS — BP 128/64 | HR 82 | Resp 16 | Ht 73.0 in | Wt 203.0 lb

## 2021-11-19 DIAGNOSIS — Z0000 Encounter for general adult medical examination without abnormal findings: Secondary | ICD-10-CM | POA: Diagnosis not present

## 2021-11-19 DIAGNOSIS — R7303 Prediabetes: Secondary | ICD-10-CM

## 2021-11-19 DIAGNOSIS — Z1329 Encounter for screening for other suspected endocrine disorder: Secondary | ICD-10-CM

## 2021-11-19 DIAGNOSIS — E785 Hyperlipidemia, unspecified: Secondary | ICD-10-CM | POA: Diagnosis not present

## 2021-11-19 DIAGNOSIS — Z125 Encounter for screening for malignant neoplasm of prostate: Secondary | ICD-10-CM

## 2021-11-19 DIAGNOSIS — L570 Actinic keratosis: Secondary | ICD-10-CM

## 2021-11-19 DIAGNOSIS — Z13228 Encounter for screening for other metabolic disorders: Secondary | ICD-10-CM

## 2021-11-19 DIAGNOSIS — Z889 Allergy status to unspecified drugs, medicaments and biological substances status: Secondary | ICD-10-CM

## 2021-11-19 MED ORDER — CETIRIZINE HCL 10 MG PO TABS
10.0000 mg | ORAL_TABLET | Freq: Every day | ORAL | 1 refills | Status: DC
Start: 2021-11-19 — End: 2022-05-05

## 2021-11-19 NOTE — Progress Notes (Signed)
Annual Wellness Visit  I,Dennis Barrera,acting as a scribe for [name], MD.,have documented all relevant documentation on the behalf of [name], MD,as directed by  [name], MD while in the presence of [name], MD.     Patient: Dennis Barrera, Male    DOB: 04-Nov-1950, 71 y.o.   MRN: [id number] Visit Date: 11/19/2021  Today's Provider: [name], MD   Chief Complaint  Patient presents with   Medicare Wellness   Subjective    Dennis Barrera is a 71 y.o. male who presents today for his Annual Wellness Visit.  Annual physical. He reports consuming a general diet. Home exercise routine includes walking.Marland Kitchen He generally feels well. He reports sleeping fairly well. He does not have additional problems to discuss today.   HPI    Medications: Outpatient Medications Prior to Visit  Medication Sig   [DISCONTINUED] ibuprofen (ADVIL) 200 MG tablet Take 200 mg by mouth daily as needed for moderate pain.   No facility-administered medications prior to visit.    No Known Allergies  Patient Care Team: Jerrol Banana., MD as PCP - General (Family Medicine) Jerrol Banana., MD (Family Medicine) Bary Castilla Forest Gleason, MD (General Surgery)  Review of Systems  Eyes:  Positive for discharge and itching.  Musculoskeletal:  Positive for back pain.  All other systems reviewed and are negative.   Last metabolic panel Lab Results  Component Value Date   GLUCOSE 111 (H) 11/19/2021   NA 140 11/19/2021   K 4.6 11/19/2021   CL 103 11/19/2021   CO2 24 11/19/2021   BUN 19 11/19/2021   CREATININE 0.89 11/19/2021   EGFR 93 11/19/2021   CALCIUM 9.4 11/19/2021   PROT 6.5 11/19/2021   ALBUMIN 4.0 11/19/2021   LABGLOB 2.5 11/19/2021   AGRATIO 1.6 11/19/2021   BILITOT 0.5 11/19/2021   ALKPHOS 73 11/19/2021   AST 18 11/19/2021   ALT 17 11/19/2021        Objective    Vitals: BP 128/64 (BP Location: Left Arm, Patient  Position: Sitting, Cuff Size: Large)   Pulse 82   Resp 16   Ht _0  (1.854 m)   Wt 203 lb (92.1 kg)   SpO2 96%   BMI 26.78 kg/m  BP Readings from Last 3 Encounters:  11/19/21 128/64  11/18/20 123/80  06/16/20 (!) 144/91   Wt Readings from Last 3 Encounters:  11/19/21 203 lb (92.1 kg)  11/18/20 200 lb (90.7 kg)  06/16/20 195 lb 15.8 oz (88.9 kg)       Physical Exam Vitals and nursing note reviewed.  Constitutional:      Appearance: Normal appearance. He is normal weight.  HENT:     Right Ear: Tympanic membrane normal.     Left Ear: Tympanic membrane normal.     Nose: Nose normal.     Mouth/Throat:     Mouth: Mucous membranes are moist.  Cardiovascular:     Rate and Rhythm: Normal rate and regular rhythm.     Pulses: Normal pulses.     Heart sounds: Normal heart sounds.  Pulmonary:     Effort: Pulmonary effort is normal.     Breath sounds: Normal breath sounds.  Abdominal:     General: Bowel sounds are normal.     Palpations: Abdomen is soft.  Genitourinary:    Penis: Normal.      Testes: Normal.  Musculoskeletal:     Cervical  back: Normal range of motion and neck supple.     Right lower leg: No edema.     Left lower leg: No edema.  Skin:    General: Skin is warm.  Neurological:     General: No focal deficit present.     Mental Status: He is alert and oriented to person, place, and time.  Psychiatric:        Mood and Affect: Mood normal.        Behavior: Behavior normal.        Thought Content: Thought content normal.        Judgment: Judgment normal.      Most recent functional status assessment:    11/19/2021    9:05 AM  In your present state of health, do you have any difficulty performing the following activities:  Hearing? 0  Vision? 0  Difficulty concentrating or making decisions? 0  Walking or climbing stairs? 0  Dressing or bathing? 0  Doing errands, shopping? 0   Most recent fall risk assessment:    11/19/2021    9:03 AM  Fall Risk    Falls in the past year? 0  Number falls in past yr: 0  Injury with Fall? 0  Risk for fall due to : No Fall Risks  Follow up Falls evaluation completed    Most recent depression screenings:    11/19/2021    9:04 AM 11/18/2020    9:03 AM  PHQ 2/9 Scores  PHQ - 2 Score 0 0  PHQ- 9 Score 0 0   Most recent cognitive screening:     No data to display         Most recent Audit-C alcohol use screening    11/19/2021    9:03 AM  Alcohol Use Disorder Test (AUDIT)  1. How often do you have a drink containing alcohol? 1  2. How many drinks containing alcohol do you have on a typical day when you are drinking? 0  3. How often do you have six or more drinks on one occasion? 0  AUDIT-C Score 1   A score of 3 or more in women, and 4 or more in men indicates increased risk for alcohol abuse, EXCEPT if all of the points are from question 1   No results found for any visits on 11/19/21.  Assessment & Plan     Annual wellness visit done today including the all of the following: Reviewed patient's Family Medical History Reviewed and updated list of patient's medical providers Assessment of cognitive impairment was done Assessed patient's functional ability Established a written schedule for health screening services Health Risk Assessent Completed and Reviewed  Exercise Activities and Dietary recommendations  Goals   None     Immunization History  Administered Date(s) Administered   Influenza-Unspecified 03/04/2019   Pneumococcal Polysaccharide-23 11/14/2019   Tdap 10/25/2014    Health Maintenance  Topic Date Due   COVID-19 Vaccine (1) Never done   Zoster Vaccines- Shingrix (1 of 2) Never done   Pneumonia Vaccine 4+ Years old (2 - PCV) 11/13/2020   INFLUENZA VACCINE  12/01/2021   TETANUS/TDAP  10/24/2024   COLONOSCOPY (Pts 45-47yr Insurance coverage will need to be confirmed)  07/07/2026   Hepatitis C Screening  Completed   HPV VACCINES  Aged Out     Discussed  health benefits of physical activity, and encouraged him to engage in regular exercise appropriate for his age and condition.    1. Encounter for Medicare  annual wellness exam   2. Annual physical exam Up-to-date on all screening  3. Hyperlipidemia, unspecified hyperlipidemia type  - Lipid panel - TSH - CBC w/Diff/Platelet - Comprehensive Metabolic Panel (CMET)  4. Screening for metabolic disorder  - Lipid panel - TSH - CBC w/Diff/Platelet - Comprehensive Metabolic Panel (CMET)  5. Screening for thyroid disorder  - Lipid panel - TSH - CBC w/Diff/Platelet - Comprehensive Metabolic Panel (CMET)  6. Prediabetes  - Hemoglobin A1c  7. Prostate cancer screening  - PSA  8. H/O seasonal allergies Mainly excess watering in his left eye.  May need to follow-up with his eye doctor - cetirizine (ZYRTEC) 10 MG tablet; Take 1 tablet (10 mg total) by mouth daily.  Dispense: 90 tablet; Refill: 1  9. Solar keratosis  - Ambulatory referral to Dermatology    Return in about 1 year (around 11/20/2022).     I, [name], MD, have reviewed all documentation for this visit. The documentation on 11/21/21 for the exam, diagnosis, procedures, and orders are all accurate and complete.    Chike Farrington Cranford Mon, MD  [facility] [phone] (phone) [phone] (fax)  [place]

## 2021-11-20 LAB — HEMOGLOBIN A1C
Est. average glucose Bld gHb Est-mCnc: 123 mg/dL
Hgb A1c MFr Bld: 5.9 % — ABNORMAL HIGH (ref 4.8–5.6)

## 2021-11-20 LAB — CBC WITH DIFFERENTIAL/PLATELET
Basophils Absolute: 0.1 10*3/uL (ref 0.0–0.2)
Basos: 1 %
EOS (ABSOLUTE): 0.3 10*3/uL (ref 0.0–0.4)
Eos: 5 %
Hematocrit: 44.6 % (ref 37.5–51.0)
Hemoglobin: 14.9 g/dL (ref 13.0–17.7)
Immature Grans (Abs): 0 10*3/uL (ref 0.0–0.1)
Immature Granulocytes: 0 %
Lymphocytes Absolute: 1.7 10*3/uL (ref 0.7–3.1)
Lymphs: 27 %
MCH: 27.6 pg (ref 26.6–33.0)
MCHC: 33.4 g/dL (ref 31.5–35.7)
MCV: 83 fL (ref 79–97)
Monocytes Absolute: 0.5 10*3/uL (ref 0.1–0.9)
Monocytes: 8 %
Neutrophils Absolute: 3.7 10*3/uL (ref 1.4–7.0)
Neutrophils: 59 %
Platelets: 330 10*3/uL (ref 150–450)
RBC: 5.39 x10E6/uL (ref 4.14–5.80)
RDW: 12.1 % (ref 11.6–15.4)
WBC: 6.3 10*3/uL (ref 3.4–10.8)

## 2021-11-20 LAB — COMPREHENSIVE METABOLIC PANEL
ALT: 17 IU/L (ref 0–44)
AST: 18 IU/L (ref 0–40)
Albumin/Globulin Ratio: 1.6 (ref 1.2–2.2)
Albumin: 4 g/dL (ref 3.9–4.9)
Alkaline Phosphatase: 73 IU/L (ref 44–121)
BUN/Creatinine Ratio: 21 (ref 10–24)
BUN: 19 mg/dL (ref 8–27)
Bilirubin Total: 0.5 mg/dL (ref 0.0–1.2)
CO2: 24 mmol/L (ref 20–29)
Calcium: 9.4 mg/dL (ref 8.6–10.2)
Chloride: 103 mmol/L (ref 96–106)
Creatinine, Ser: 0.89 mg/dL (ref 0.76–1.27)
Globulin, Total: 2.5 g/dL (ref 1.5–4.5)
Glucose: 111 mg/dL — ABNORMAL HIGH (ref 70–99)
Potassium: 4.6 mmol/L (ref 3.5–5.2)
Sodium: 140 mmol/L (ref 134–144)
Total Protein: 6.5 g/dL (ref 6.0–8.5)
eGFR: 93 mL/min/{1.73_m2} (ref >59)

## 2021-11-20 LAB — LIPID PANEL
Chol/HDL Ratio: 4 ratio (ref 0.0–5.0)
Cholesterol, Total: 199 mg/dL (ref 100–199)
HDL: 50 mg/dL (ref >39)
LDL Chol Calc (NIH): 132 mg/dL — ABNORMAL HIGH (ref 0–99)
Triglycerides: 92 mg/dL (ref 0–149)
VLDL Cholesterol Cal: 17 mg/dL (ref 5–40)

## 2021-11-20 LAB — TSH: TSH: 1.47 u[IU]/mL (ref 0.450–4.500)

## 2021-11-20 LAB — PSA: Prostate Specific Ag, Serum: 1.5 ng/mL (ref 0.0–4.0)

## 2022-02-05 IMAGING — MR MR KNEE*L* W/O CM
6 series · 40 of 40 positions shown · non-contrast
Comparison: None.

CLINICAL DATA: Left knee pain for 2 months.  Medial pain.

EXAM:
MRI OF THE LEFT KNEE WITHOUT CONTRAST
TECHNIQUE: Multiplanar, multisequence MR imaging of the knee was performed. No
intravenous contrast was administered.

[Series 8: T2 fat-sat · axial · left · 4.0mm · 0.53mm/px · z∈[-71,+54]mm · 7 of 26 slices shown (1 of 3)]
[im 1/26]
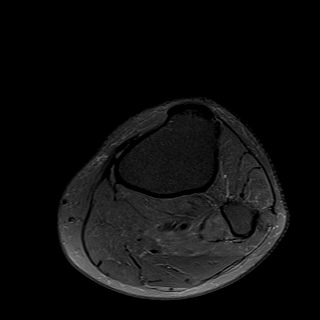
[im 5/26]
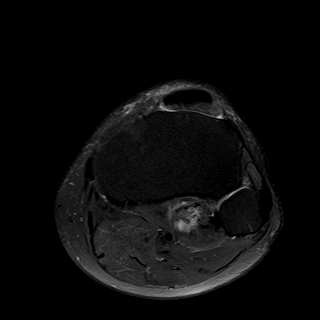
[im 9/26]
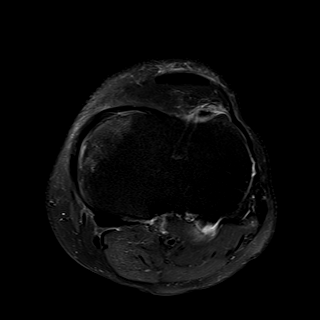
[im 13/26]
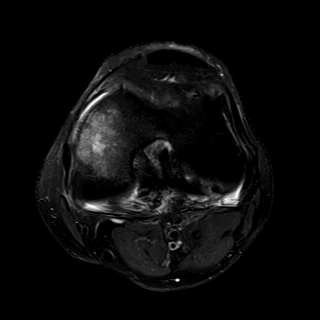
[im 17/26]
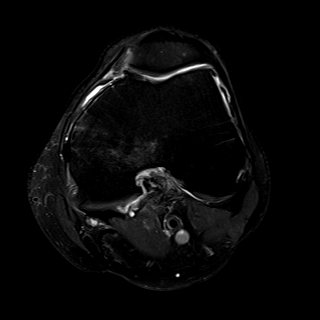
[im 21/26]
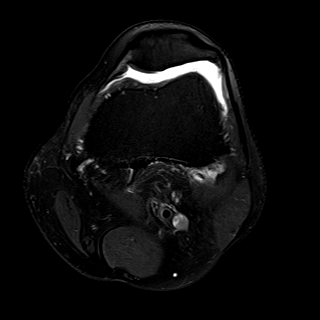
[im 26/26]
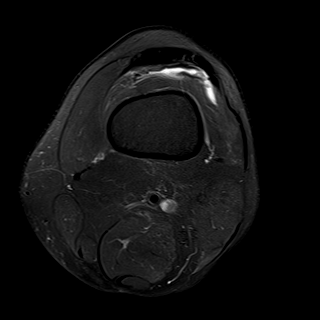

[Series 9: T2 fat-sat · coronal · left · 4.0mm · 0.59mm/px · 6 of 30 slices shown (2 of 3)]
[im 1/30]
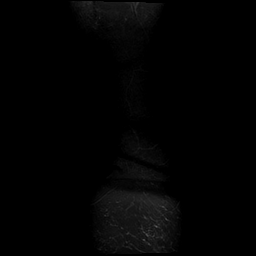
[im 6/30]
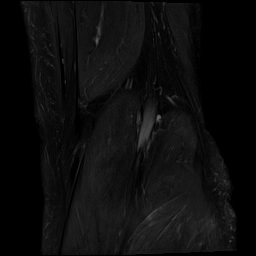
[im 12/30]
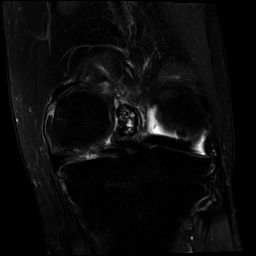
[im 18/30]
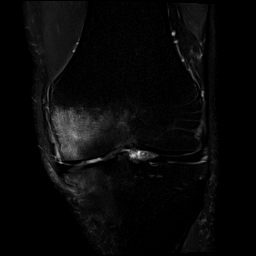
[im 24/30]
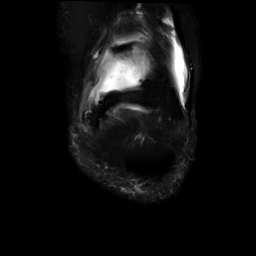
[im 30/30]
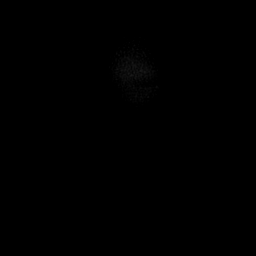

[Series 10: T1 · coronal · left · 4.0mm · 0.59mm/px · 6 of 30 slices shown]
[im 1/30]
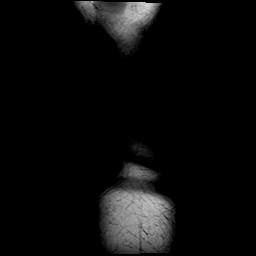
[im 6/30]
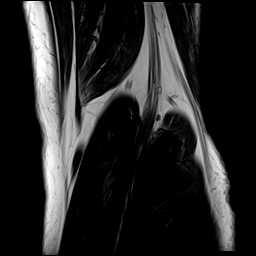
[im 12/30]
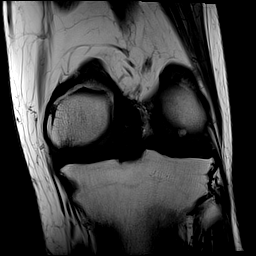
[im 18/30]
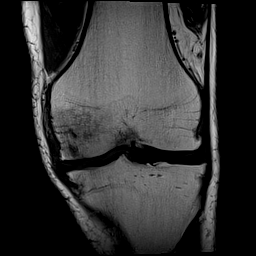
[im 24/30]
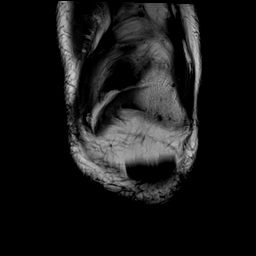
[im 30/30]
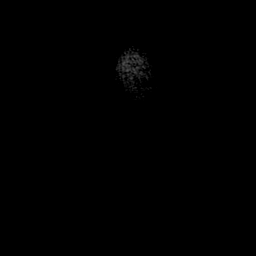

[Series 11: PD fat-sat · coronal · left · 4.0mm · 0.59mm/px · 6 of 30 slices shown (1 of 2)]
[im 1/30]
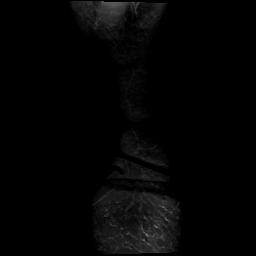
[im 6/30]
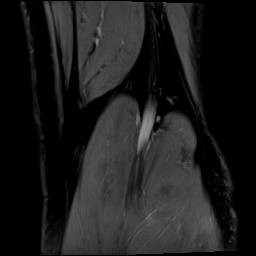
[im 12/30]
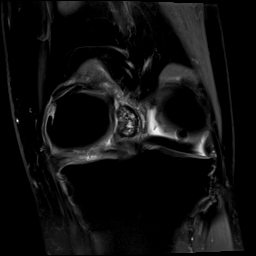
[im 18/30]
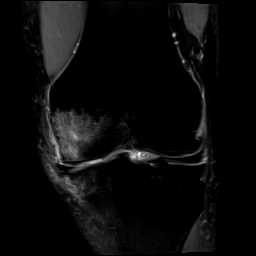
[im 24/30]
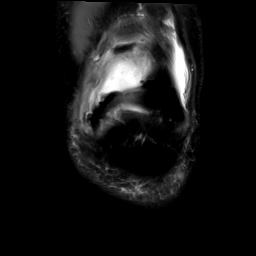
[im 30/30]
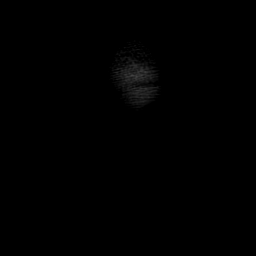

[Series 12: PD fat-sat · sagittal · left · 3.0mm · 0.59mm/px · 7 of 34 slices shown (2 of 2)]
[im 1/34]
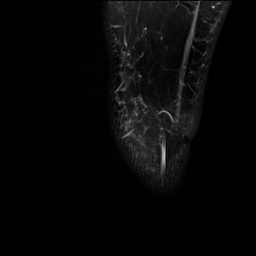
[im 6/34]
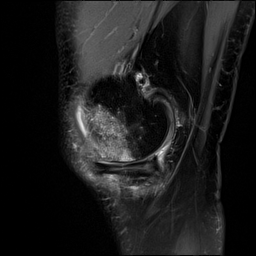
[im 12/34]
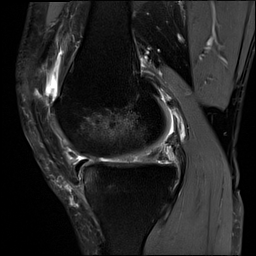
[im 17/34]
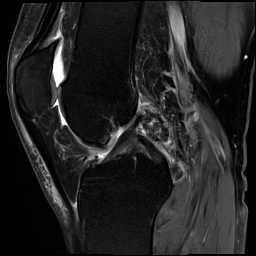
[im 23/34]
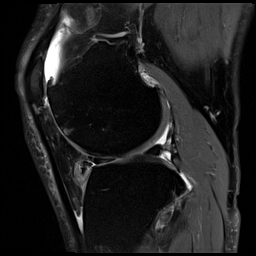
[im 28/34]
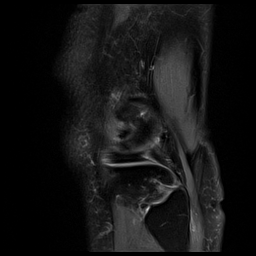
[im 34/34]
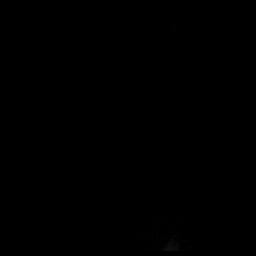

[Series 13: T2 fat-sat · sagittal · left · 3.0mm · 0.59mm/px · 8 of 35 slices shown (3 of 3)]
[im 1/35]
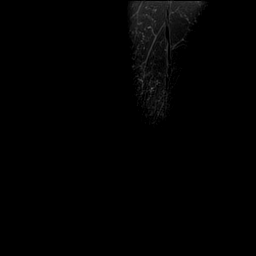
[im 5/35]
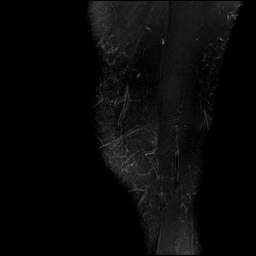
[im 10/35]
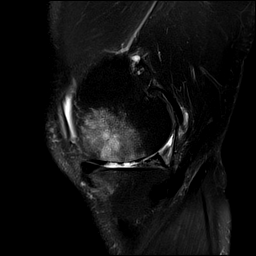
[im 15/35]
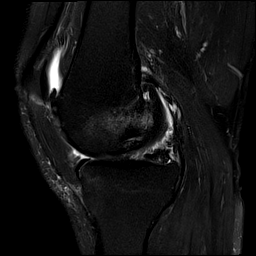
[im 20/35]
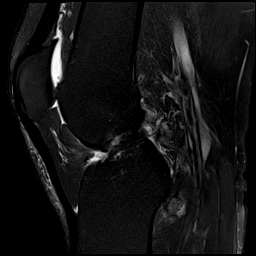
[im 25/35]
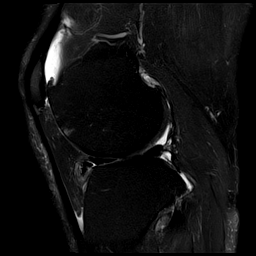
[im 30/35]
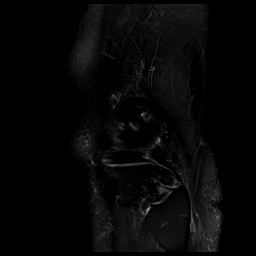
[im 35/35]
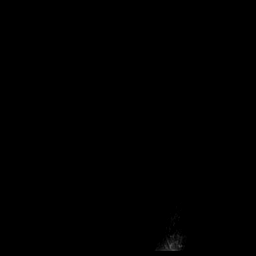

[40 of 40 positions shown; findings below may reference images not displayed]

FINDINGS: MENISCI

Medial: Severe complex tear of the posterior horn of the medial
meniscus extending into the body with peripheral meniscal extrusion.

Lateral: Intact.

LIGAMENTS

Cruciates: ACL and PCL are intact.

Collaterals: Medial collateral ligament is intact. Lateral
collateral ligament complex is intact.

CARTILAGE

Patellofemoral: High-grade partial-thickness cartilage loss of the
lateral patellofemoral compartment with areas of full-thickness
cartilage loss. Mild partial-thickness cartilage loss of the medial
femorotibial compartment.

Medial: Full-thickness cartilage loss of the medial femorotibial
compartment with subchondral marrow edema.

Lateral: Mild partial-thickness cartilage loss of the lateral
femorotibial compartment.

JOINT: Small joint effusion. Normal Zingin Ambert. No plical
thickening. Multiple loose bodies posterior to the PCL with the
largest measuring 2.7 cm.

POPLITEAL FOSSA: Popliteus tendon is intact. No Baker's cyst. 2 cm
loose body in the popliteus tendon sheath.

EXTENSOR MECHANISM: Intact quadriceps tendon. Intact patellar
tendon. Intact lateral patellar retinaculum. Intact medial patellar
retinaculum. Intact MPFL.

BONES: No aggressive osseous lesion. Subchondral linear low signal
in the medial femoral condyle with severe surrounding marrow edema
concerning for a nondisplaced, nondepressed insufficiency fracture
(image 12/series 13.

Other: No fluid collection or hematoma. Muscles are normal.
IMPRESSION: 1. Severe complex tear of the posterior horn of the medial meniscus
extending into the body with peripheral meniscal extrusion.
2. Tricompartmental cartilage abnormalities as described above most
severe in the medial femorotibial compartment.
3. Subchondral linear low signal in the medial femoral condyle with
severe surrounding marrow edema concerning for a nondisplaced,
nondepressed insufficiency fracture (image 12/series 13.

## 2022-05-05 ENCOUNTER — Encounter: Payer: Self-pay | Admitting: Family Medicine

## 2022-05-05 ENCOUNTER — Ambulatory Visit (INDEPENDENT_AMBULATORY_CARE_PROVIDER_SITE_OTHER): Payer: Medicare HMO | Admitting: Family Medicine

## 2022-05-05 VITALS — BP 120/78 | HR 79 | Temp 98.4°F | Resp 16 | Wt 201.5 lb

## 2022-05-05 DIAGNOSIS — U071 COVID-19: Secondary | ICD-10-CM | POA: Insufficient documentation

## 2022-05-05 DIAGNOSIS — Z20822 Contact with and (suspected) exposure to covid-19: Secondary | ICD-10-CM | POA: Insufficient documentation

## 2022-05-05 LAB — POC COVID19 BINAXNOW: SARS Coronavirus 2 Ag: POSITIVE — AB

## 2022-05-05 NOTE — Assessment & Plan Note (Signed)
Home test positive 05/04/22; however, "cold" symptoms extending into last week Discussed use of quarantine for 5 days, marking today as day 2 since positive test yesterday; recommend additional masking for another 5 days Declines use of anti-virals to assist given minor symptoms  Continue OTC supportive medications to assist

## 2022-05-05 NOTE — Progress Notes (Signed)
I,Joseline E Rosas,acting as a scribe for Gwyneth Sprout, FNP.,have documented all relevant documentation on the behalf of Gwyneth Sprout, FNP,as directed by  Gwyneth Sprout, FNP while in the presence of Gwyneth Sprout, FNP.   Established patient visit   Patient: Dennis Barrera   DOB: 05/18/1950   72 y.o. Male  MRN: [id number] Visit Date: 05/05/2022  Today's healthcare provider: Gwyneth Sprout, FNP  Introduced to nurse practitioner role and practice setting.  All questions answered.  Discussed provider/patient relationship and expectations.   Chief Complaint  Patient presents with   Covid Positive    At home   Subjective    HPI HPI     Covid Positive    Additional comments: At home      Last edited by Doristine Devoid, CMA on 05/05/2022  1:47 PM.      Patient here today to be tested for Covid. Patient did covid test at home which was positive. Current symptoms: slight cough and pressure on forehead  Medications: Outpatient Medications Prior to Visit  Medication Sig   [DISCONTINUED] cetirizine (ZYRTEC) 10 MG tablet Take 1 tablet (10 mg total) by mouth daily.   No facility-administered medications prior to visit.    Review of Systems    Objective    BP 120/78 (BP Location: Left Arm, Patient Position: Sitting, Cuff Size: Normal)   Pulse 79   Temp 98.4 F (36.9 C) (Oral)   Resp 16   Wt 201 lb 8 oz (91.4 kg)   SpO2 100%   BMI 26.58 kg/m   Physical Exam Vitals and nursing note reviewed.  Constitutional:      General: He is not in acute distress.    Appearance: Normal appearance. He is well-groomed and overweight. He is not ill-appearing, toxic-appearing or diaphoretic.  HENT:     Head: Normocephalic and atraumatic.  Eyes:     Pupils: Pupils are equal, round, and reactive to light.  Cardiovascular:     Rate and Rhythm: Normal rate and regular rhythm.     Pulses: Normal pulses.     Heart sounds: Normal heart sounds.  Pulmonary:     Effort: Pulmonary  effort is normal.     Breath sounds: Normal breath sounds.  Musculoskeletal:        General: Normal range of motion.     Cervical back: Normal range of motion.  Skin:    General: Skin is warm and dry.     Capillary Refill: Capillary refill takes less than 2 seconds.  Neurological:     General: No focal deficit present.     Mental Status: He is alert and oriented to person, place, and time. Mental status is at baseline.  Psychiatric:        Mood and Affect: Mood normal.        Behavior: Behavior normal.        Thought Content: Thought content normal.        Judgment: Judgment normal.     Results for orders placed or performed in visit on 05/05/22  POC COVID-19  Result Value Ref Range   SARS Coronavirus 2 Ag Positive (A) Negative    Assessment & Plan     Problem List Items Addressed This Visit       Other   COVID-19    Home test positive 05/04/22; however, "cold" symptoms extending into last week Discussed use of quarantine for 5 days, marking today as day 2  since positive test yesterday; recommend additional masking for another 5 days Declines use of anti-virals to assist given minor symptoms  Continue OTC supportive medications to assist       Exposure to COVID-19 virus - Primary   Relevant Orders   POC COVID-19 (Completed)   Return if symptoms worsen or fail to improve.     Vonna Kotyk, FNP, have reviewed all documentation for this visit. The documentation on 05/05/22 for the exam, diagnosis, procedures, and orders are all accurate and complete.  Gwyneth Sprout, [place] [phone] (phone) [phone] (fax)  [place]

## 2022-06-03 ENCOUNTER — Ambulatory Visit: Payer: Medicare HMO | Admitting: Dermatology

## 2022-07-15 ENCOUNTER — Ambulatory Visit: Payer: Medicare HMO | Admitting: Dermatology

## 2022-07-15 VITALS — BP 129/68

## 2022-07-15 DIAGNOSIS — L820 Inflamed seborrheic keratosis: Secondary | ICD-10-CM

## 2022-07-15 DIAGNOSIS — L578 Other skin changes due to chronic exposure to nonionizing radiation: Secondary | ICD-10-CM | POA: Diagnosis not present

## 2022-07-15 DIAGNOSIS — L821 Other seborrheic keratosis: Secondary | ICD-10-CM | POA: Diagnosis not present

## 2022-07-15 DIAGNOSIS — L570 Actinic keratosis: Secondary | ICD-10-CM | POA: Diagnosis not present

## 2022-07-15 DIAGNOSIS — L814 Other melanin hyperpigmentation: Secondary | ICD-10-CM

## 2022-07-15 NOTE — Progress Notes (Signed)
   New Patient Visit  Subjective  Dennis Barrera is a 72 y.o. male who presents for the following: Follow-up (New patient - referral from [facility] practice for solar keratoses) The patient has spots, moles and lesions to be evaluated, some may be new or changing and the patient has concerns that these could be cancer.  The following portions of the chart were reviewed this encounter and updated as appropriate:   Tobacco  Allergies  Meds  Problems  Med Hx  Surg Hx  Fam Hx     Review of Systems:  No other skin or systemic complaints except as noted in HPI or Assessment and Plan.  Objective  Well appearing patient in no apparent distress; mood and affect are within normal limits.  A focused examination was performed including face, scalp. Relevant physical exam findings are noted in the Assessment and Plan.  Scalp x 4, right ear x 1, nose x 1 (6) Erythematous thin papules/macules with gritty scale.   Left clavicle x 1, right forearm x 1 (2) Erythematous stuck-on, waxy papule or plaque   Assessment & Plan   Actinic Damage - chronic, secondary to cumulative UV radiation exposure/sun exposure over time - diffuse scaly erythematous macules with underlying dyspigmentation - Recommend daily broad spectrum sunscreen SPF 30+ to sun-exposed areas, reapply every 2 hours as needed.  - Recommend staying in the shade or wearing long sleeves, sun glasses (UVA+UVB protection) and wide brim hats (4-inch brim around the entire circumference of the hat). - Call for new or changing lesions.  Seborrheic Keratoses - Stuck-on, waxy, tan-brown papules and/or plaques  - Benign-appearing - Discussed benign etiology and prognosis. - Observe - Call for any changes  AK (actinic keratosis) (6) Scalp x 4, right ear x 1, nose x 1  Destruction of lesion - Scalp x 4, right ear x 1, nose x 1 Complexity: simple   Destruction method: cryotherapy   Informed consent: discussed and consent  obtained   Timeout:  patient name, date of birth, surgical site, and procedure verified Lesion destroyed using liquid nitrogen: Yes   Region frozen until ice ball extended beyond lesion: Yes   Outcome: patient tolerated procedure well with no complications   Post-procedure details: wound care instructions given    Inflamed seborrheic keratosis (2) Left clavicle x 1, right forearm x 1  Destruction of lesion - Left clavicle x 1, right forearm x 1 Complexity: simple   Destruction method: cryotherapy   Informed consent: discussed and consent obtained   Timeout:  patient name, date of birth, surgical site, and procedure verified Lesion destroyed using liquid nitrogen: Yes   Region frozen until ice ball extended beyond lesion: Yes   Outcome: patient tolerated procedure well with no complications   Post-procedure details: wound care instructions given    Lentigines - Scattered tan macules - Due to sun exposure - Benign-appearing, observe - Recommend daily broad spectrum sunscreen SPF 30+ to sun-exposed areas, reapply every 2 hours as needed. - Call for any changes  Return in about 1 year (around 07/15/2023).  I, Ashok Cordia, CMA, am acting as scribe for Sarina Ser, MD . Documentation: I have reviewed the above documentation for accuracy and completeness, and I agree with the above.  Sarina Ser, MD

## 2022-07-15 NOTE — Patient Instructions (Signed)
Cryotherapy Aftercare  Wash gently with soap and water everyday.   Apply Vaseline and Band-Aid daily until healed.     Due to recent changes in healthcare laws, you may see results of your pathology and/or laboratory studies on MyChart before the doctors have had a chance to review them. We understand that in some cases there may be results that are confusing or concerning to you. Please understand that not all results are received at the same time and often the doctors may need to interpret multiple results in order to provide you with the best plan of care or course of treatment. Therefore, we ask that you please give us 2 business days to thoroughly review all your results before contacting the office for clarification. Should we see a critical lab result, you will be contacted sooner.   If You Need Anything After Your Visit  If you have any questions or concerns for your doctor, please call our main line at [phone] and press option 4 to reach your doctor's medical assistant. If no one answers, please leave a voicemail as directed and we will return your call as soon as possible. Messages left after 4 pm will be answered the following business day.   You may also send us a message via MyChart. We typically respond to MyChart messages within 1-2 business days.  For prescription refills, please ask your pharmacy to contact our office. Our fax number is [phone].  If you have an urgent issue when the clinic is closed that cannot wait until the next business day, you can page your doctor at the number below.    Please note that while we do our best to be available for urgent issues outside of office hours, we are not available 24/7.   If you have an urgent issue and are unable to reach us, you may choose to seek medical care at your doctor's office, retail clinic, urgent care center, or emergency room.  If you have a medical emergency, please immediately call 911 or go to the  emergency department.  Pager Numbers  - Dr. Kowalski: [phone]  - Dr. Moye: [phone]  - Dr. Stewart: [phone]  In the event of inclement weather, please call our main line at [phone] for an update on the status of any delays or closures.  Dermatology Medication Tips: Please keep the boxes that topical medications come in in order to help keep track of the instructions about where and how to use these. Pharmacies typically print the medication instructions only on the boxes and not directly on the medication tubes.   If your medication is too expensive, please contact our office at [phone] option 4 or send us a message through MyChart.   We are unable to tell what your co-pay for medications will be in advance as this is different depending on your insurance coverage. However, we may be able to find a substitute medication at lower cost or fill out paperwork to get insurance to cover a needed medication.   If a prior authorization is required to get your medication covered by your insurance company, please allow us 1-2 business days to complete this process.  Drug prices often vary depending on where the prescription is filled and some pharmacies may offer cheaper prices.  The website www.goodrx.com contains coupons for medications through different pharmacies. The prices here do not account for what the cost may be with help from insurance (it may be cheaper with your insurance), but the website can   give you the price if you did not use any insurance.  - You can print the associated coupon and take it with your prescription to the pharmacy.  - You may also stop by our office during regular business hours and pick up a GoodRx coupon card.  - If you need your prescription sent electronically to a different pharmacy, notify our office through [facility] MyChart or by phone at [phone] option 4.     Si Usted Necesita Algo Despus de Su Visita  Tambin puede  enviarnos un mensaje a travs de MyChart. Por lo general respondemos a los mensajes de MyChart en el transcurso de 1 a 2 das hbiles.  Para renovar recetas, por favor pida a su farmacia que se ponga en contacto con nuestra oficina. Nuestro nmero de fax es el [phone].  Si tiene un asunto urgente cuando la clnica est cerrada y que no puede esperar hasta el siguiente da hbil, puede llamar/localizar a su doctor(a) al nmero que aparece a continuacin.   Por favor, tenga en cuenta que aunque hacemos todo lo posible para estar disponibles para asuntos urgentes fuera del horario de oficina, no estamos disponibles las 24 horas del da, los 7 das de la semana.   Si tiene un problema urgente y no puede comunicarse con nosotros, puede optar por buscar atencin mdica  en el consultorio de su doctor(a), en una clnica privada, en un centro de atencin urgente o en una sala de emergencias.  Si tiene una emergencia mdica, por favor llame inmediatamente al 911 o vaya a la sala de emergencias.  Nmeros de bper  - Dr. Kowalski: [phone]  - Dra. Moye: [phone]  - Dra. Stewart: [phone]  En caso de inclemencias del tiempo, por favor llame a nuestra lnea principal al [phone] para una actualizacin sobre el estado de cualquier retraso o cierre.  Consejos para la medicacin en dermatologa: Por favor, guarde las cajas en las que vienen los medicamentos de uso tpico para ayudarle a seguir las instrucciones sobre dnde y cmo usarlos. Las farmacias generalmente imprimen las instrucciones del medicamento slo en las cajas y no directamente en los tubos del medicamento.   Si su medicamento es muy caro, por favor, pngase en contacto con nuestra oficina llamando al [phone] y presione la opcin 4 o envenos un mensaje a travs de MyChart.   No podemos decirle cul ser su copago por los medicamentos por adelantado ya que esto es diferente dependiendo de la cobertura de su seguro.  Sin embargo, es posible que podamos encontrar un medicamento sustituto a menor costo o llenar un formulario para que el seguro cubra el medicamento que se considera necesario.   Si se requiere una autorizacin previa para que su compaa de seguros cubra su medicamento, por favor permtanos de 1 a 2 das hbiles para completar este proceso.  Los precios de los medicamentos varan con frecuencia dependiendo del lugar de dnde se surte la receta y alguna farmacias pueden ofrecer precios ms baratos.  El sitio web www.goodrx.com tiene cupones para medicamentos de diferentes farmacias. Los precios aqu no tienen en cuenta lo que podra costar con la ayuda del seguro (puede ser ms barato con su seguro), pero el sitio web puede darle el precio si no utiliz ningn seguro.  - Puede imprimir el cupn correspondiente y llevarlo con su receta a la farmacia.  - Tambin puede pasar por nuestra oficina durante el horario de atencin regular y recoger una tarjeta de cupones de GoodRx.  -   Si necesita que su receta se enve electrnicamente a una farmacia diferente, informe a nuestra oficina a travs de MyChart de [facility] o por telfono llamando al [phone] y presione la opcin 4.  

## 2022-07-16 ENCOUNTER — Encounter: Payer: Self-pay | Admitting: Dermatology

## 2022-10-27 ENCOUNTER — Telehealth: Payer: Self-pay

## 2022-10-27 NOTE — Telephone Encounter (Signed)
Copied from CRM [id number]. Topic: General - Other >> Oct 27, 2022 12:11 PM Pincus Sanes wrote: Reason for CRM: Pt received a cb for a wellness, he is no longer with BFF with Dr Sullivan Lone now.

## 2022-11-19 DIAGNOSIS — N401 Enlarged prostate with lower urinary tract symptoms: Secondary | ICD-10-CM | POA: Diagnosis not present

## 2022-11-19 DIAGNOSIS — R739 Hyperglycemia, unspecified: Secondary | ICD-10-CM | POA: Diagnosis not present

## 2022-11-19 DIAGNOSIS — Z125 Encounter for screening for malignant neoplasm of prostate: Secondary | ICD-10-CM | POA: Diagnosis not present

## 2022-11-19 DIAGNOSIS — E7800 Pure hypercholesterolemia, unspecified: Secondary | ICD-10-CM | POA: Diagnosis not present

## 2022-11-19 DIAGNOSIS — Z0000 Encounter for general adult medical examination without abnormal findings: Secondary | ICD-10-CM | POA: Diagnosis not present

## 2022-11-19 DIAGNOSIS — R3912 Poor urinary stream: Secondary | ICD-10-CM | POA: Diagnosis not present

## 2022-11-22 ENCOUNTER — Encounter: Payer: Medicare HMO | Admitting: Family Medicine

## 2023-03-03 DIAGNOSIS — G5691 Unspecified mononeuropathy of right upper limb: Secondary | ICD-10-CM | POA: Diagnosis not present

## 2023-04-20 DIAGNOSIS — M542 Cervicalgia: Secondary | ICD-10-CM | POA: Diagnosis not present

## 2023-04-20 DIAGNOSIS — R29898 Other symptoms and signs involving the musculoskeletal system: Secondary | ICD-10-CM | POA: Diagnosis not present

## 2023-04-20 DIAGNOSIS — R200 Anesthesia of skin: Secondary | ICD-10-CM | POA: Diagnosis not present

## 2023-04-20 DIAGNOSIS — R202 Paresthesia of skin: Secondary | ICD-10-CM | POA: Diagnosis not present

## 2023-05-18 DIAGNOSIS — M1711 Unilateral primary osteoarthritis, right knee: Secondary | ICD-10-CM | POA: Diagnosis not present

## 2023-05-18 DIAGNOSIS — G8929 Other chronic pain: Secondary | ICD-10-CM | POA: Diagnosis not present

## 2023-05-18 DIAGNOSIS — M25561 Pain in right knee: Secondary | ICD-10-CM | POA: Diagnosis not present

## 2023-06-27 DIAGNOSIS — R29898 Other symptoms and signs involving the musculoskeletal system: Secondary | ICD-10-CM | POA: Diagnosis not present

## 2023-06-27 DIAGNOSIS — M542 Cervicalgia: Secondary | ICD-10-CM | POA: Diagnosis not present

## 2023-06-27 DIAGNOSIS — R202 Paresthesia of skin: Secondary | ICD-10-CM | POA: Diagnosis not present

## 2023-06-27 DIAGNOSIS — R200 Anesthesia of skin: Secondary | ICD-10-CM | POA: Diagnosis not present

## 2023-06-29 ENCOUNTER — Other Ambulatory Visit: Payer: Self-pay | Admitting: Neurology

## 2023-06-29 DIAGNOSIS — R200 Anesthesia of skin: Secondary | ICD-10-CM

## 2023-06-29 DIAGNOSIS — M542 Cervicalgia: Secondary | ICD-10-CM

## 2023-06-29 DIAGNOSIS — R29898 Other symptoms and signs involving the musculoskeletal system: Secondary | ICD-10-CM

## 2023-07-04 ENCOUNTER — Encounter: Payer: Self-pay | Admitting: Neurology

## 2023-07-04 DIAGNOSIS — R200 Anesthesia of skin: Secondary | ICD-10-CM | POA: Diagnosis not present

## 2023-07-04 DIAGNOSIS — R202 Paresthesia of skin: Secondary | ICD-10-CM | POA: Diagnosis not present

## 2023-07-12 ENCOUNTER — Ambulatory Visit
Admission: RE | Admit: 2023-07-12 | Discharge: 2023-07-12 | Disposition: A | Discharge status: Home / Self Care | Source: Ambulatory Visit | Attending: Neurology | Admitting: Neurology

## 2023-07-12 DIAGNOSIS — M542 Cervicalgia: Secondary | ICD-10-CM | POA: Diagnosis not present

## 2023-07-12 DIAGNOSIS — M4802 Spinal stenosis, cervical region: Secondary | ICD-10-CM | POA: Diagnosis not present

## 2023-07-12 DIAGNOSIS — R200 Anesthesia of skin: Secondary | ICD-10-CM

## 2023-07-12 DIAGNOSIS — R29898 Other symptoms and signs involving the musculoskeletal system: Secondary | ICD-10-CM

## 2023-07-21 ENCOUNTER — Ambulatory Visit: Payer: Medicare HMO | Admitting: Dermatology

## 2023-07-21 DIAGNOSIS — R202 Paresthesia of skin: Secondary | ICD-10-CM | POA: Diagnosis not present

## 2023-07-21 DIAGNOSIS — R200 Anesthesia of skin: Secondary | ICD-10-CM | POA: Diagnosis not present

## 2023-07-21 DIAGNOSIS — M542 Cervicalgia: Secondary | ICD-10-CM | POA: Diagnosis not present

## 2023-07-21 DIAGNOSIS — R278 Other lack of coordination: Secondary | ICD-10-CM | POA: Diagnosis not present

## 2023-07-25 ENCOUNTER — Ambulatory Visit: Attending: Neurology | Admitting: Occupational Therapy

## 2023-07-25 DIAGNOSIS — M6281 Muscle weakness (generalized): Secondary | ICD-10-CM | POA: Diagnosis not present

## 2023-07-25 DIAGNOSIS — R278 Other lack of coordination: Secondary | ICD-10-CM | POA: Diagnosis not present

## 2023-07-25 NOTE — Therapy (Signed)
 OUTPATIENT OCCUPATIONAL THERAPY NEURO EVALUATION  Patient Name: Dennis Barrera MRN: [id number] DOB:February 13, 1951, 73 y.o., male Today's Date: 07/25/2023  PCP: Julieanne Manson, MD REFERRING PROVIDER: Theora Master, MD  END OF SESSION:  OT End of Session - 07/25/23 1355     Visit Number 1    Number of Visits 24    Date for OT Re-Evaluation 10/17/23    Authorization Type Progress reporting period starting 07/25/23    OT Start Time 1150    OT Stop Time 1235    OT Time Calculation (min) 45 min    Activity Tolerance Patient tolerated treatment well    Behavior During Therapy [facility] for tasks assessed/performed             No past medical history on file. Past Surgical History:  Procedure Laterality Date   COLONOSCOPY WITH PROPOFOL N/A 07/06/2016   Procedure: COLONOSCOPY WITH PROPOFOL;  Surgeon: Wyline Mood, MD;  Location: [facility];  Service: Endoscopy;  Laterality: N/A;   CYST EXCISION  02/05/14   lower back   INGUINAL HERNIA REPAIR Left 01/02/2020   Procedure: HERNIA REPAIR INGUINAL ADULT;  Surgeon: Earline Mayotte, MD;  Location: [facility] ORS;  Service: General;  Laterality: Left;   KNEE ARTHROSCOPY Left 06/16/2020   Procedure: ARTHROSCOPY KNEE LEFT;  Surgeon: Donato Heinz, MD;  Location: [facility] ORS;  Service: Orthopedics;  Laterality: Left;   SHOULDER SURGERY     TONSILLECTOMY     WISDOM TOOTH EXTRACTION     Patient Active Problem List   Diagnosis Date Noted   Exposure to COVID-19 virus 05/05/2022   COVID-19 05/05/2022   Cervical spondylosis without myelopathy 10/24/2014   Cervical nerve root disorder 10/24/2014   Degeneration of cervical intervertebral disc 10/24/2014   Sebaceous cyst 02/06/2014    ONSET DATE: 03/03/2023  REFERRING DIAG: Cervicalgia, numbness/tingling, RUE weakness, decreased dexterity  THERAPY DIAG:  Muscle weakness (generalized)  Other lack of coordination  Rationale for Evaluation and Treatment: Rehabilitation  SUBJECTIVE:    SUBJECTIVE STATEMENT: Pt. 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 is a 73 y.o. male who was referred for OT services 2/2 decreased right hand strength, with decreased right hand dexterity over the past several months, as well as numbness and tingling in the Left. Pt. presents with Right thumb, and 2nd digit weakness with atrophy at the right 1st dorsal interossei, and thenar eminence. History of cervicalgia, 1/10 scapular pain radiating down to the Left elbow. (Of note Pt. reports approximately 15 years ago, waking up with a popping-like pain in the neck with immediate onset of 4th, and 5th digit numbness) Pt. Is currently undergoing work-up, and imaging. Pt. PMHx includes: Cervical spondylosis without myelopathy, Cervical nerve root disorder, Degeneration of cervical intervertebral disc, COVID-19, Sabaceous cyst  PRECAUTIONS: None  WEIGHT BEARING RESTRICTIONS: No  PAIN:  Are you having pain? No pain at the initial eval  FALLS: Has patient fallen in last 6 months? 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  HAND DOMINANCE: Right  ADLs:  Transfers/ambulation related to ADLs: Eating: Reports Clumsy using utensil,using a fork to spear food, Difficulty  with cutting food. Difficulty holding a larger glass Grooming: Adapted, uses left to pump lotion, and squeeze the end of the toothpaste. Brushes teeth with the right hand UB Dressing: decreased strength to hold zipper, fatigues with buttons, increased  time. LB Dressing: tying shoes-loose, unable if its cold.  Independent donning pants, socks. Difficulty with the belt, and pant button. Toileting: Independent Bathing: loses control on the soap Tub Shower transfers: Independent   IADLs: Shopping: more time adapted the Light housekeeping:  More difficulty with the fitted sheet, holding dishes. Meal Prep: Cutting  Community mobility: Independent Medication management:  uses left hand the opposite way to open Financial management: No changes, check writing takes longer Handwriting: 75% legible printing, 50% cursive Hobbies: Golfing, working in the yard, fixing the house Work:  MOBILITY STATUS: Independent  POSTURE COMMENTS:  No Significant postural limitations Sitting balance:  Intact  ACTIVITY TOLERANCE: Activity tolerance:  good  FUNCTIONAL OUTCOME MEASURES: MAM-20: 66/80  sum score: 63.3 MAM Measure score  UPPER EXTREMITY ROM:    Active ROM Right eval Left Eval [facility]  Shoulder flexion 112(128)   Shoulder abduction 72(110)   Shoulder adduction    Shoulder extension    Shoulder internal rotation    Shoulder external rotation    Elbow flexion WFL   Elbow extension WFL   Wrist flexion WFL   Wrist extension WFL   Wrist ulnar deviation    Wrist radial deviation    Wrist pronation    Wrist supination    (Blank rows = not tested)  Thumb opposition: Right:  to the tip of the 4th digit. Pt. Is able to make a full composite fist.  UPPER EXTREMITY MMT:     MMT Right eval Left Eval 5/5  Shoulder flexion 4-/5   Shoulder abduction 3-/5   Shoulder adduction    Shoulder extension    Shoulder internal rotation    Shoulder external rotation    Middle trapezius    Lower trapezius    Elbow flexion 5/5   Elbow extension 5/5   Wrist flexion 5/5   Wrist extension 5/5   Wrist ulnar deviation    Wrist radial deviation    Wrist pronation    Wrist supination    (Blank rows = not tested)  HAND FUNCTION: Grip strength: Right: 62 lbs; Left: 83 lbs, Lateral pinch: Right: 7 lbs,  Left: 20 lbs, 3 point pinch: Right: 6 lbs, Left: 11 lbs, and Tip pinch: Right 7 lbs, Left: 13 lbs  COORDINATION: 9 Hole Peg test: Right: 33 sec; Left: 28 sec  SENSATION: Light touch: WFL Proprioception: WFL  EDEMA: N/A  MUSCLE TONE: atrophy at the right 1st dorsal interossei, thenar eminence  COGNITION: Overall cognitive status: Within functional limits for tasks assessed  VISION: Subjective report: No change from baseline   PERCEPTION: WFL  PRAXIS: [facility]  TREATMENT DATE: 07/25/2023   Initial evaluation completed, and Pt. education was provided as indicated.  PATIENT EDUCATION: Education details: OT services, POC, goals,, ADL functioning. Person educated: Patient Education method: Medical illustrator Education comprehension: verbalized understanding and returned demonstration  HOME EXERCISE PROGRAM: Continue to assess need for, and provided as indicated.   GOALS: Goals reviewed with patient? Yes  SHORT TERM GOALS: Target date: 09/05/2023    Pt.will be independent with HEPs for the RUE, and hand Baseline: Eval: No current HEP Goal status: INITIAL   LONG TERM GOALS: Target date: 10/17/2023   Pt. Will increase RUE shoulder  ROM by 10 degrees to improve RUE functional reach. Baseline: Eval: Right shoulder flexion: 112(128), Abduction: 72(110)  Goal status: INITIAL  2.  Pt. Will  increase right grip strength by 5# to be able to securely turn a doorknob. Baseline: Eval: Right: 62#, Left: 83# Goal status: INITIAL  3.  Pt. Will improve right lateral pinch strength by 5# to assist with fork use during meals.  Baseline: Eval: Right: 7#, Left: 20# Goal status: INITIAL  4.  Pt. Will improve pinch strength by 3# to be able to open packets/packages Baseline: Eval: 3pt. Pinch: R: 6#, L: 11#; 2pt. Pinch: R: 7#, L: 13# Goal status: INITIAL  5.   Pt. Will improve right hand [facility] skills by 3 sec. To be able to button a shirt efficiently.  Baseline:  Eval: 9 hole peg test: Right: 33 sec., L: 28 sec. Goal status: INITIAL  6.  Pt. Will write a sentence legibly, and efficiently. Baseline: Eval: Name only: 75% legibility for printed form, 50% legibility for cursive form Goal status: INITIAL  ASSESSMENT:  CLINICAL IMPRESSION:  Patient is a 73 y.o. male who was seen today for occupational therapy evaluation for right hand weakness, and decreased dexterity. Pt. presents with limited AROM right shoulder flexion/abduction, limited grip strength, pinch strength, and FMC skills which hinders his ability to efficiently handle a fork during meals, cut food, turn a door knob,securely hold items, open packets/bottles/containers, button a shirt, zip a jacket, and write legibly and efficiently. MAM-20 sum score: 66/80, MAM Measure score: 63.3. Pt. will benefit from OT services to work on improving RUE functioning in order to increase RUE engagement in, and maximize performance during ADLs, and IADL tasks.   PERFORMANCE DEFICITS: in functional skills including ADLs, IADLs, coordination, dexterity, proprioception, ROM, strength, pain, Fine motor control, Gross motor control, decreased knowledge of use of DME, and UE functional use, cognitive skills including , and psychosocial skills including environmental adaptation and routines and behaviors.   IMPAIRMENTS: are limiting patient from ADLs, IADLs, education, work, and leisure.   CO-MORBIDITIES: may have co-morbidities  that affects occupational performance. Patient will benefit from skilled OT to address above impairments and improve overall function.  MODIFICATION OR ASSISTANCE TO COMPLETE EVALUATION: Min-Moderate modification of tasks or assist with assess necessary to complete an evaluation.  OT OCCUPATIONAL PROFILE AND HISTORY: Detailed assessment: Review of records and additional review of physical,  cognitive, psychosocial history related to current functional performance.  CLINICAL DECISION MAKING: Moderate - several treatment options, min-mod task modification necessary  REHAB POTENTIAL: Good  EVALUATION COMPLEXITY: Moderate    PLAN:  OT FREQUENCY: 2x/week  OT DURATION: 12 weeks  PLANNED INTERVENTIONS: 97168 OT Re-evaluation, 97535 self care/ADL training, 16109 therapeutic exercise, 97530 therapeutic activity, 97112 neuromuscular re-education, 97140 manual therapy, 97018 paraffin, 60454 moist heat, 97034 contrast bath, 97033 iontophoresis, 97760 Splinting (initial encounter), [id number] Subsequent splinting/medication, passive range of  motion, visual/perceptual remediation/compensation, patient/family education, and DME and/or AE instructions  RECOMMENDED OTHER SERVICES: PT  CONSULTED AND AGREED WITH PLAN OF CARE: Patient  PLAN FOR NEXT SESSION: Treatment   Olegario Messier, MS, OTR/L  07/25/2023, 1:58 PM

## 2023-08-01 ENCOUNTER — Encounter: Payer: Self-pay | Admitting: Occupational Therapy

## 2023-08-01 ENCOUNTER — Ambulatory Visit: Admitting: Occupational Therapy

## 2023-08-01 DIAGNOSIS — R278 Other lack of coordination: Secondary | ICD-10-CM | POA: Diagnosis not present

## 2023-08-01 DIAGNOSIS — M6281 Muscle weakness (generalized): Secondary | ICD-10-CM

## 2023-08-01 NOTE — Therapy (Signed)
 OUTPATIENT OCCUPATIONAL THERAPY NEURO TREATMENT  Patient Name: Dennis Barrera MRN: [id number] DOB:1950-06-27, 73 y.o., male Today's Date: 08/01/2023  PCP: Julieanne Manson, MD REFERRING PROVIDER: Theora Master, MD  END OF SESSION:  OT End of Session - 08/01/23 1527     Visit Number 2    Number of Visits 24    Date for OT Re-Evaluation 10/17/23    Authorization Type Progress reporting period starting 07/25/23    OT Start Time 1145    OT Stop Time 1234    OT Time Calculation (min) 49 min    Activity Tolerance Patient tolerated treatment well    Behavior During Therapy [facility] for tasks assessed/performed             History reviewed. No pertinent past medical history. Past Surgical History:  Procedure Laterality Date   COLONOSCOPY WITH PROPOFOL N/A 07/06/2016   Procedure: COLONOSCOPY WITH PROPOFOL;  Surgeon: Wyline Mood, MD;  Location: [facility];  Service: Endoscopy;  Laterality: N/A;   CYST EXCISION  02/05/14   lower back   INGUINAL HERNIA REPAIR Left 01/02/2020   Procedure: HERNIA REPAIR INGUINAL ADULT;  Surgeon: Earline Mayotte, MD;  Location: [facility] ORS;  Service: General;  Laterality: Left;   KNEE ARTHROSCOPY Left 06/16/2020   Procedure: ARTHROSCOPY KNEE LEFT;  Surgeon: Donato Heinz, MD;  Location: [facility] OR[facility];  Service: Orthopedics;  Laterality: Left;   SHOULDER SURGERY     TONSILLECTOMY     WISDOM TOOTH EXTRACTION     Patient Active Problem List   Diagnosis Date Noted   Exposure to COVID-19 virus 05/05/2022   COVID-19 05/05/2022   Cervical spondylosis without myelopathy 10/24/2014   Cervical nerve root disorder 10/24/2014   Degeneration of cervical intervertebral disc 10/24/2014   Sebaceous cyst 02/06/2014    ONSET DATE: 03/03/2023  REFERRING DIAG: Cervicalgia, numbness/tingling, RUE weakness, decreased dexterity  THERAPY DIAG:  Muscle weakness (generalized)  Other lack of coordination  Rationale for Evaluation and Treatment:  Rehabilitation  SUBJECTIVE:   SUBJECTIVE STATEMENT: Pt denies any pain this date.  Reports he has had right shoulder issues for a long time, the weakness and hand issues have been going on about 3 months. Pt accompanied by: self  PERTINENT HISTORY:   Pt. is a 73 y.o. male who was referred for OT services 2/2 decreased right hand strength, with decreased right hand dexterity over the past several months, as well as numbness and tingling in the Left. Pt. presents with Right thumb, and 2nd digit weakness with atrophy at the right 1st dorsal interossei, and thenar eminence. History of cervicalgia, 1/10 scapular pain radiating down to the Left elbow. (Of note Pt. reports approximately 15 years ago, waking up with a popping-like pain in the neck with immediate onset of 4th, and 5th digit numbness) Pt. Is currently undergoing work-up, and imaging. Pt. PMHx includes: Cervical spondylosis without myelopathy, Cervical nerve root disorder, Degeneration of cervical intervertebral disc, COVID-19, Sabaceous cyst  PRECAUTIONS: None  WEIGHT BEARING RESTRICTIONS: No  PAIN:  Are you having pain? No pain at the initial eval  FALLS: Has patient fallen in last 6 months? 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  HAND DOMINANCE: Right  ADLs:  Transfers/ambulation related to  ADLs: Eating: Reports Clumsy using utensil,using a fork to spear food, Difficulty with cutting food. Difficulty holding a larger glass Grooming: Adapted, uses left to pump lotion, and squeeze the end of the toothpaste. Brushes teeth with the right hand UB Dressing: decreased strength to hold zipper, fatigues with buttons,  increased  time. LB Dressing: tying shoes-loose, unable if its cold. Independent donning pants, socks. Difficulty with the belt, and pant button. Toileting: Independent Bathing: loses control on the soap Tub Shower transfers: Independent   IADLs: Shopping: more time adapted the Light housekeeping:  More difficulty with the fitted sheet, holding dishes. Meal Prep: Cutting  Community mobility: Independent Medication management:  uses left hand the opposite way to open Financial management: No changes, check writing takes longer Handwriting: 75% legible printing, 50% cursive Hobbies: Golfing, working in the yard, fixing the house Work:  MOBILITY STATUS: Independent  POSTURE COMMENTS:  No Significant postural limitations Sitting balance:  Intact  ACTIVITY TOLERANCE: Activity tolerance:  good  FUNCTIONAL OUTCOME MEASURES: MAM-20: 66/80  sum score: 63.3 MAM Measure score  UPPER EXTREMITY ROM:    Active ROM Right eval Left Eval [facility]  Shoulder flexion 112(128)   Shoulder abduction 72(110)   Shoulder adduction    Shoulder extension    Shoulder internal rotation    Shoulder external rotation    Elbow flexion WFL   Elbow extension WFL   Wrist flexion WFL   Wrist extension WFL   Wrist ulnar deviation    Wrist radial deviation    Wrist pronation    Wrist supination    (Blank rows = not tested)  Thumb opposition: Right:  to the tip of the 4th digit. Pt. Is able to make a full composite fist.  UPPER EXTREMITY MMT:     MMT Right eval Left Eval 5/5  Shoulder flexion 4-/5   Shoulder abduction 3-/5   Shoulder adduction    Shoulder extension    Shoulder internal rotation    Shoulder external rotation    Middle trapezius    Lower trapezius    Elbow flexion 5/5   Elbow extension 5/5   Wrist flexion 5/5   Wrist extension 5/5   Wrist ulnar deviation    Wrist radial deviation    Wrist pronation    Wrist supination    (Blank rows = not tested)  HAND FUNCTION: Grip  strength: Right: 62 lbs; Left: 83 lbs, Lateral pinch: Right: 7 lbs, Left: 20 lbs, 3 point pinch: Right: 6 lbs, Left: 11 lbs, and Tip pinch: Right 7 lbs, Left: 13 lbs  COORDINATION: 9 Hole Peg test: Right: 33 sec; Left: 28 sec  SENSATION: Light touch: WFL Proprioception: WFL  EDEMA: N/A  MUSCLE TONE: atrophy at the right 1st dorsal interossei, thenar eminence  COGNITION: Overall cognitive status: Within functional limits for tasks assessed  VISION: Subjective report: No change from baseline   PERCEPTION: WFL  PRAXIS: [facility]  TREATMENT DATE: 08/01/2023   Pt denies any pain this date, he reports he works on the computer a lot each day and has tried to look at his work space, alternating between sitting and standing.  Feels in standing he cannot get his keyboard at the right height and tends to look more downward to computer.    ADL:   Discussed computer workspace set up and potentially adjusting height of the table, may need to look at using stands for monitors or laptop to encourage a better line of vision to avoid extended periods of time with neck flexion.  May need to look at built up surfaces for gripping patterns to encourage increased use of right UE, he has been using left hand more often for tasks which require increased strength and grip.    Therapeutic Exercises:   Pt seen this date for focus on grip strength with right hand, issued and instructed on red, medium soft resistive putty for grip strengthening, lateral pinch, 3 point pinch and 2 point pinch for 1-2 sets of 10-15 reps each.  Can perform at home daily 1-2 times a day, but monitor repetitions and don't overdo to cause increased pain.   [email or web address] Access [email or web address]: [email or web address]  Pt also seen for neck ROM and stretches for lateral flexion, head turning/rotation, chin tucks, and combo  with rotation and chin tuck mid position.  Performed 10 reps each with therapist demonstration and cues for proper form/technique.    Hand gripper for sustained gripping patterns, setting 3 and 4 with white spring on hand gripper, one set at each level for 15 reps.    Neuromuscular Reeducation:   Pt seen this date with focus on fine motor coordination patterns with emphasis on prehension patterns to pick up glass beads, translatory movements of the hand to move items to palm, use the hand for storage and then move items back to fingertips to place into container.  This task was also combined with reaching patterns on the right, pt tends to compensate at the shoulder with shoulder hiking and substitution of movements.     PATIENT EDUCATION: Education details: OT services, POC, goals,, ADL functioning. Person educated: Patient Education method: Medical illustrator Education comprehension: verbalized understanding and returned demonstration  HOME EXERCISE PROGRAM: Continue to assess need for, and provided as indicated.   GOALS: Goals reviewed with patient? Yes  SHORT TERM GOALS: Target date: 09/05/2023    Pt.will be independent with HEPs for the RUE, and hand Baseline: Eval: No current HEP Goal status: INITIAL   LONG TERM GOALS: Target date: 10/17/2023   Pt. Will increase RUE shoulder  ROM by 10 degrees to improve RUE functional reach. Baseline: Eval: Right shoulder flexion: 112(128), Abduction: 72(110)  Goal status: INITIAL  2.  Pt. Will  increase right grip strength by 5# to be able to securely turn a doorknob. Baseline: Eval: Right: 62#, Left: 83# Goal status: INITIAL  3.  Pt. Will improve right lateral pinch strength by 5# to assist with fork use during meals.  Baseline: Eval: Right: 7#, Left: 20# Goal status: INITIAL  4.  Pt. Will improve pinch strength by 3# to be able to open packets/packages Baseline: Eval: 3pt. Pinch: R: 6#, L: 11#; 2pt. Pinch: R: 7#, L:  13# Goal status: INITIAL  5.  Pt. Will improve right hand [facility] skills by 3 sec. To be able to button a shirt efficiently.  Baseline:  Eval: 9 hole peg test: Right: 33 sec., L: 28 sec. Goal status:  INITIAL  6.  Pt. Will write a sentence legibly, and efficiently. Baseline: Eval: Name only: 75% legibility for printed form, 50% legibility for cursive form Goal status: INITIAL  ASSESSMENT:  CLINICAL IMPRESSION:  Pt seen this date for initial treatment session with focus on right UE ROM, strengthening and coordination skills.  Pt with a history of shoulder limitations and tends to compensate with movement patterns which involve reaching and multidirectional use.  Pt presents with decreased right hand strength with some muscle wasting noted in webspace and thenar eminence.  Issued and instructed on use of red medium soft putty for grip and pinch patterns to use at home, neck ROM exercises and coordination skills with using the hand for storage, translatory skills of the hand and prehension patterns. Pt responds well to cues and demonstration, exercises challenge pt's strength, he needs to monitor repetitions for home and adjust if needed.  May benefit from built up surfaces for gripping and continue to make recommendations on workspace to avoid patterns which may result in more neck compression.    Pt. will benefit from OT services to work on improving RUE functioning in order to increase RUE engagement in, and maximize performance during ADLs, and IADL tasks.   PERFORMANCE DEFICITS: in functional skills including ADLs, IADLs, coordination, dexterity, proprioception, ROM, strength, pain, Fine motor control, Gross motor control, decreased knowledge of use of DME, and UE functional use, cognitive skills including , and psychosocial skills including environmental adaptation and routines and behaviors.   IMPAIRMENTS: are limiting patient from ADLs, IADLs, education, work, and leisure.   CO-MORBIDITIES: may  have co-morbidities  that affects occupational performance. Patient will benefit from skilled OT to address above impairments and improve overall function.  MODIFICATION OR ASSISTANCE TO COMPLETE EVALUATION: Min-Moderate modification of tasks or assist with assess necessary to complete an evaluation.  OT OCCUPATIONAL PROFILE AND HISTORY: Detailed assessment: Review of records and additional review of physical, cognitive, psychosocial history related to current functional performance.  CLINICAL DECISION MAKING: Moderate - several treatment options, min-mod task modification necessary  REHAB POTENTIAL: Good  EVALUATION COMPLEXITY: Moderate    PLAN:  OT FREQUENCY: 2x/week  OT DURATION: 12 weeks  PLANNED INTERVENTIONS: 97168 OT Re-evaluation, 97535 self care/ADL training, 16109 therapeutic exercise, 97530 therapeutic activity, 97112 neuromuscular re-education, 97140 manual therapy, 97018 paraffin, 60454 moist heat, 97034 contrast bath, 97033 iontophoresis, 97760 Splinting (initial encounter), [id number] Subsequent splinting/medication, passive range of motion, visual/perceptual remediation/compensation, patient/family education, and DME and/or AE instructions  RECOMMENDED OTHER SERVICES: PT  CONSULTED AND AGREED WITH PLAN OF CARE: Patient  PLAN FOR NEXT SESSION: review HEP, neck stretches, built up foam for gripping surfaces.    Mariateresa Batra T Arne [place], OTR/L, CLT  08/01/2023, 3:28 PM

## 2023-08-08 ENCOUNTER — Ambulatory Visit: Attending: Neurology

## 2023-08-08 DIAGNOSIS — R278 Other lack of coordination: Secondary | ICD-10-CM | POA: Diagnosis not present

## 2023-08-08 DIAGNOSIS — M6281 Muscle weakness (generalized): Secondary | ICD-10-CM | POA: Diagnosis not present

## 2023-08-08 NOTE — Therapy (Signed)
 OUTPATIENT OCCUPATIONAL THERAPY NEURO TREATMENT  Patient Name: Dennis Barrera MRN: [id number] DOB:1950-10-22, 73 y.o., male Today's Date: 08/08/2023  PCP: Julieanne Manson, MD REFERRING PROVIDER: Theora Master, MD  END OF SESSION:  OT End of Session - 08/08/23 1418     Visit Number 3    Number of Visits 24    Date for OT Re-Evaluation 10/17/23    Authorization Type Progress reporting period starting 07/25/23    OT Start Time 1100    OT Stop Time 1145    OT Time Calculation (min) 45 min    Activity Tolerance Patient tolerated treatment well    Behavior During Therapy [facility] for tasks assessed/performed            No past medical history on file. Past Surgical History:  Procedure Laterality Date   COLONOSCOPY WITH PROPOFOL N/A 07/06/2016   Procedure: COLONOSCOPY WITH PROPOFOL;  Surgeon: Wyline Mood, MD;  Location: [facility] ENDOSCOPY;  Service: Endoscopy;  Laterality: N/A;   CYST EXCISION  02/05/14   lower back   INGUINAL HERNIA REPAIR Left 01/02/2020   Procedure: HERNIA REPAIR INGUINAL ADULT;  Surgeon: Earline Mayotte, MD;  Location: [facility] ORS;  Service: General;  Laterality: Left;   KNEE ARTHROSCOPY Left 06/16/2020   Procedure: ARTHROSCOPY KNEE LEFT;  Surgeon: Donato Heinz, MD;  Location: [facility] ORS;  Service: Orthopedics;  Laterality: Left;   SHOULDER SURGERY     TONSILLECTOMY     WISDOM TOOTH EXTRACTION     Patient Active Problem List   Diagnosis Date Noted   Exposure to COVID-19 virus 05/05/2022   COVID-19 05/05/2022   Cervical spondylosis without myelopathy 10/24/2014   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  See below. Pt accompanied by: self  PERTINENT HISTORY:   Pt. is a 73 y.o. male who was referred for OT services 2/2 decreased right hand strength, with decreased right hand dexterity over the past several months, as well as numbness and tingling in the Left. Pt. presents with Right thumb, and 2nd digit weakness with atrophy at the right 1st dorsal interossei, and thenar eminence. History of cervicalgia, 1/10 scapular pain radiating down to the Left elbow. (Of note Pt. reports approximately 15 years ago, waking up with a popping-like pain in the neck with immediate onset of 4th, and 5th digit numbness) Pt. Is currently undergoing work-up, and imaging. Pt. PMHx includes: Cervical spondylosis without myelopathy, Cervical nerve root disorder, Degeneration of cervical intervertebral disc, COVID-19, Sabaceous cyst  PRECAUTIONS: None  WEIGHT BEARING RESTRICTIONS: No  PAIN:  Are you having pain? No pain at the initial eval  FALLS: Has patient fallen in last 6 months? 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  HAND DOMINANCE: Right  ADLs:  Transfers/ambulation related to ADLs: Eating: Reports Clumsy using utensil,using a fork to spear food,  Difficulty with cutting food. Difficulty holding a larger glass Grooming: Adapted, uses left to pump lotion, and squeeze the end of the toothpaste. Brushes teeth with the right hand UB Dressing: decreased strength to hold zipper, fatigues with buttons, increased  time. LB Dressing: tying shoes-loose, unable if its cold. Independent  donning pants, socks. Difficulty with the belt, and pant button. Toileting: Independent Bathing: loses control on the soap Tub Shower transfers: Independent   IADLs: Shopping: more time adapted the Light housekeeping:  More difficulty with the fitted sheet, holding dishes. Meal Prep: Cutting  Community mobility: Independent Medication management:  uses left hand the opposite way to open Financial management: No changes, check writing takes longer Handwriting: 75% legible printing, 50% cursive Hobbies: Golfing, working in the yard, fixing the house Work:  MOBILITY STATUS: Independent  POSTURE COMMENTS:  No Significant postural limitations Sitting balance:  Intact  ACTIVITY TOLERANCE: Activity tolerance:  good  FUNCTIONAL OUTCOME MEASURES: MAM-20: 66/80  sum score: 63.3 MAM Measure score  UPPER EXTREMITY ROM:    Active ROM Right eval Left Eval [facility]  Shoulder flexion 112(128)   Shoulder abduction 72(110)   Shoulder adduction    Shoulder extension    Shoulder internal rotation    Shoulder external rotation    Elbow flexion WFL   Elbow extension WFL   Wrist flexion WFL   Wrist extension WFL   Wrist ulnar deviation    Wrist radial deviation    Wrist pronation    Wrist supination    (Blank rows = not tested)  Thumb opposition: Right:  to the tip of the 4th digit. Pt. Is able to make a full composite fist.  UPPER EXTREMITY MMT:     MMT Right eval Left Eval 5/5  Shoulder flexion 4-/5   Shoulder abduction 3-/5   Shoulder adduction    Shoulder extension    Shoulder internal rotation    Shoulder external rotation    Middle trapezius    Lower trapezius    Elbow flexion 5/5   Elbow extension 5/5   Wrist flexion 5/5   Wrist extension 5/5   Wrist ulnar deviation    Wrist radial deviation    Wrist pronation    Wrist supination    (Blank rows = not tested)  HAND FUNCTION: Grip strength: Right: 62 lbs; Left: 83 lbs, Lateral pinch: Right: 7 lbs, Left: 20 lbs,  3 point pinch: Right: 6 lbs, Left: 11 lbs, and Tip pinch: Right 7 lbs, Left: 13 lbs  COORDINATION: 9 Hole Peg test: Right: 33 sec; Left: 28 sec  SENSATION: Light touch: WFL Proprioception: WFL  EDEMA: N/A  MUSCLE TONE: atrophy at the right 1st dorsal interossei, thenar eminence  COGNITION: Overall cognitive status: Within functional limits for tasks assessed  VISION: Subjective report: No change from baseline   PERCEPTION: WFL  PRAXIS: [facility]  TREATMENT DATE: 08/08/2023  Therapeutic Exercises:   -Pink theraputty exercises reviewed; provided visual handout for gross grasping, digit opposition, lateral and 3 point pinching, and digit ext, abd/add; advised on grading digit ext up/down to tolerance -Instructed in/completed R shoulder flexibility exercises: dowel walk with dowel positioned on table top to promote R shoulder flexion; x5 reps, 10-20 sec hold at the top for prolonged stretch -Dowel stretch for R shoulder abd; mod visual cues and demo for correct positioning/minimizing substitution patterns.  Transitioned to back against wall for external cue to optimize RUE positioning; performed within pain free range. -R grip strengthening: Hand gripper set at 35.3# (brown gripper/black spring) to remove jumbo pegs from pegboard x3 trials each.  Visual demo for passive wrist ext stretch following gripping reps -R pinch strengthening: pt used therapy resistant clothespins to clip pins on/off a vertical dowel, completing 2 trials for each pinch type (1 on/1 off), including lateral (all colors but black), 3 point (all colors but blue and black), and 2 point pinch patterns (all colors but blue and black).    PATIENT EDUCATION: Education details: HEP progression Person educated: Patient Education method: Medical illustrator Education comprehension: verbalized  understanding and returned demonstration  HOME EXERCISE PROGRAM: Pink theraputty, cane stretches  GOALS: Goals reviewed with patient? Yes  SHORT TERM GOALS: Target date: 09/05/2023    Pt.will be independent with HEPs for the RUE, and hand Baseline: Eval: No current HEP Goal status: INITIAL   LONG TERM GOALS: Target date: 10/17/2023   Pt. Will increase RUE shoulder  ROM by 10 degrees to improve RUE functional reach. Baseline: Eval: Right shoulder flexion: 112(128), Abduction: 72(110)  Goal status: INITIAL  2.  Pt. Will  increase right grip strength by 5# to be able to securely turn a doorknob. Baseline: Eval: Right: 62#, Left: 83# Goal status: INITIAL  3.  Pt. Will improve right lateral pinch strength by 5# to assist with fork use during meals.  Baseline: Eval: Right: 7#, Left: 20# Goal status: INITIAL  4.  Pt. Will improve pinch strength by 3# to be able to open packets/packages Baseline: Eval: 3pt. Pinch: R: 6#, L: 11#; 2pt. Pinch: R: 7#, L: 13# Goal status: INITIAL  5.  Pt. Will improve right hand [facility] skills by 3 sec. To be able to button a shirt efficiently.  Baseline:  Eval: 9 hole peg test: Right: 33 sec., L: 28 sec. Goal status: INITIAL  6.  Pt. Will write a sentence legibly, and efficiently. Baseline: Eval: Name only: 75% legibility for printed form, 50% legibility for cursive form Goal status: INITIAL  ASSESSMENT:  CLINICAL IMPRESSION: HEP progressed this date to include additional theraputty exercises and dowel stretches to maximize R shoulder flexibility.  Good tolerance to all exercises without pain reported.  Standing with back against wall for external cue during R shoulder abd helpful to promote optimal form.  Pt reports that he did make some adjustments to his workspace at home and bought a new chair for height adjustments to avoid neck flexion during computer use.  Pt. will continue to benefit from OT services to work on improving RUE functioning in order to  increase RUE engagement in, and maximize performance during ADLs and IADL tasks.   PERFORMANCE DEFICITS: in functional skills including ADLs, IADLs, coordination, dexterity, proprioception, ROM, strength, pain, Fine motor control, Gross motor control, decreased knowledge of use of DME, and UE functional use, cognitive skills including , and psychosocial skills including environmental adaptation and routines and behaviors.  IMPAIRMENTS: are limiting patient from ADLs, IADLs, education, work, and leisure.   CO-MORBIDITIES: may have co-morbidities  that affects occupational performance. Patient will benefit from skilled OT to address above impairments and improve overall function.  MODIFICATION OR ASSISTANCE TO COMPLETE EVALUATION: Min-Moderate modification of tasks or assist with assess necessary to complete an evaluation.  OT OCCUPATIONAL PROFILE AND HISTORY: Detailed assessment: Review of records and additional review of physical, cognitive, psychosocial history related to current functional performance.  CLINICAL DECISION MAKING: Moderate - several treatment options, min-mod task modification necessary  REHAB POTENTIAL: Good  EVALUATION COMPLEXITY: Moderate    PLAN:  OT FREQUENCY: 2x/week  OT DURATION: 12 weeks  PLANNED INTERVENTIONS: 97168 OT Re-evaluation, 97535 self care/ADL training, 16109 therapeutic exercise, 97530 therapeutic activity, 97112 neuromuscular re-education, 97140 manual therapy, 97018 paraffin, 60454 moist heat, 97034 contrast bath, 97033 iontophoresis, 97760 Splinting (initial encounter), [id number] Subsequent splinting/medication, passive range of motion, visual/perceptual remediation/compensation, patient/family education, and DME and/or AE instructions  RECOMMENDED OTHER SERVICES: PT  CONSULTED AND AGREED WITH PLAN OF CARE: Patient  PLAN FOR NEXT SESSION: review HEP, neck stretches, built up foam for gripping surfaces.    Danelle Earthly, MS, OTR/L  08/08/2023,  2:19 PM

## 2023-08-15 ENCOUNTER — Ambulatory Visit

## 2023-08-15 DIAGNOSIS — M6281 Muscle weakness (generalized): Secondary | ICD-10-CM | POA: Diagnosis not present

## 2023-08-15 DIAGNOSIS — R278 Other lack of coordination: Secondary | ICD-10-CM | POA: Diagnosis not present

## 2023-08-15 NOTE — Therapy (Signed)
 OUTPATIENT OCCUPATIONAL THERAPY NEURO TREATMENT  Patient Name: Dennis Barrera MRN: [id number] DOB:1950/10/31, 73 y.o., male Today's Date: 08/15/2023  PCP: Gustavo Leigh, MD REFERRING PROVIDER: Cullen Dose, MD  END OF SESSION:  OT End of Session - 08/15/23 1526     Visit Number 4    Number of Visits 24    Date for OT Re-Evaluation 10/17/23    Authorization Type Progress reporting period starting 07/25/23    OT Start Time 1100    OT Stop Time 1145    OT Time Calculation (min) 45 min    Activity Tolerance Patient tolerated treatment well    Behavior During Therapy [facility] for tasks assessed/performed             No past medical history on file. Past Surgical History:  Procedure Laterality Date   COLONOSCOPY WITH PROPOFOL N/A 07/06/2016   Procedure: COLONOSCOPY WITH PROPOFOL;  Surgeon: Luke Salaam, MD;  Location: [facility] ENDOSCOPY;  Service: Endoscopy;  Laterality: N/A;   CYST EXCISION  02/05/14   lower back   INGUINAL HERNIA REPAIR Left 01/02/2020   Procedure: HERNIA REPAIR INGUINAL ADULT;  Surgeon: Marshall Skeeter, MD;  Location: [facility] ORS;  Service: General;  Laterality: Left;   KNEE ARTHROSCOPY Left 06/16/2020   Procedure: ARTHROSCOPY KNEE LEFT;  Surgeon: Arlyne Lame, MD;  Location: [facility] ORS;  Service: Orthopedics;  Laterality: Left;   SHOULDER SURGERY     TONSILLECTOMY     WISDOM TOOTH EXTRACTION     Patient Active Problem List   Diagnosis Date Noted   Exposure to COVID-19 virus 05/05/2022   COVID-19 05/05/2022   Cervical spondylosis without myelopathy 10/24/2014   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  Pt reports that his handwriting is still a little below baseline. Pt accompanied by: self  PERTINENT HISTORY:   Pt. is a 73 y.o. male who was referred for OT services 2/2 decreased right hand strength, with decreased right hand dexterity over the past several months, as well as numbness and tingling in the Left. Pt. presents with Right thumb, and 2nd digit weakness with atrophy at the right 1st dorsal interossei, and thenar eminence. History of cervicalgia, 1/10 scapular pain radiating down to the Left elbow. (Of note Pt. reports approximately 15 years ago, waking up with a popping-like pain in the neck with immediate onset of 4th, and 5th digit numbness) Pt. Is currently undergoing work-up, and imaging. Pt. PMHx includes: Cervical spondylosis without myelopathy, Cervical nerve root disorder, Degeneration of cervical intervertebral disc, COVID-19, Sabaceous cyst  PRECAUTIONS: None  WEIGHT BEARING RESTRICTIONS: No  PAIN:  Are you having pain? No pain at the initial eval  FALLS: Has patient fallen in last 6 months? 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  HAND DOMINANCE: Right  ADLs:  Transfers/ambulation related to ADLs: Eating: Reports Clumsy using utensil,using a fork to spear food,  Difficulty with cutting food. Difficulty holding a larger glass Grooming: Adapted, uses left to pump lotion, and squeeze the end of the toothpaste. Brushes teeth with the right hand UB Dressing: decreased strength to hold zipper, fatigues with buttons, increased  time. LB Dressing: tying shoes-loose, unable if its cold.  Independent donning pants, socks. Difficulty with the belt, and pant button. Toileting: Independent Bathing: loses control on the soap Tub Shower transfers: Independent   IADLs: Shopping: more time adapted the Light housekeeping:  More difficulty with the fitted sheet, holding dishes. Meal Prep: Cutting  Community mobility: Independent Medication management:  uses left hand the opposite way to open Financial management: No changes, check writing takes longer Handwriting: 75% legible printing, 50% cursive Hobbies: Golfing, working in the yard, fixing the house Work:  MOBILITY STATUS: Independent  POSTURE COMMENTS:  No Significant postural limitations Sitting balance:  Intact  ACTIVITY TOLERANCE: Activity tolerance:  good  FUNCTIONAL OUTCOME MEASURES: MAM-20: 66/80  sum score: 63.3 MAM Measure score  UPPER EXTREMITY ROM:    Active ROM Right eval Left Eval [facility]  Shoulder flexion 112(128)   Shoulder abduction 72(110)   Shoulder adduction    Shoulder extension    Shoulder internal rotation    Shoulder external rotation    Elbow flexion WFL   Elbow extension WFL   Wrist flexion WFL   Wrist extension WFL   Wrist ulnar deviation    Wrist radial deviation    Wrist pronation    Wrist supination    (Blank rows = not tested)  Thumb opposition: Right:  to the tip of the 4th digit. Pt. Is able to make a full composite fist.  UPPER EXTREMITY MMT:     MMT Right eval Left Eval 5/5  Shoulder flexion 4-/5   Shoulder abduction 3-/5   Shoulder adduction    Shoulder extension    Shoulder internal rotation    Shoulder external rotation    Middle trapezius    Lower trapezius    Elbow flexion 5/5   Elbow extension 5/5   Wrist flexion 5/5   Wrist extension 5/5   Wrist ulnar deviation    Wrist radial deviation    Wrist pronation    Wrist supination    (Blank rows = not tested)  HAND FUNCTION: Eval: Grip strength: Right: 62 lbs; Left: 83 lbs, Lateral pinch: Right: 7  lbs, Left: 20 lbs, 3 point pinch: Right: 6 lbs, Left: 11 lbs, and Tip pinch: Right 7 lbs, Left: 13 lbs 08/15/23: Grip strength: Right: 63>71>60 lbs (3 trials), L 87 lbs, Lateral pinch: Right: 4 lbs (Saehan pinch gauge-standard used at eval, but not available today), Left 19 lbs; 3 point pinch: Right: 5 lbs, Left: 17 lbs, and tip pinch: Right: 5 lbs, Left: 7 lbs  COORDINATION: 9 Hole Peg test: Right: 33 sec; Left: 28 sec  SENSATION: Light touch: WFL Proprioception: WFL  EDEMA: N/A  MUSCLE TONE: atrophy at the right 1st dorsal interossei, thenar eminence  COGNITION: Overall cognitive status: Within functional limits for tasks assessed  VISION: Subjective report: No change from baseline   PERCEPTION: WFL  PRAXIS: [facility]  TREATMENT DATE: 08/15/2023  Therapeutic Exercises:   -Review of pink theraputty exercises, including gross grasping, digit opposition, lateral and 3 point pinching, and digit ext, abd/add; min vc for minimizing thumb IP flexion during lateral pinching, and avoiding pushing into table as a compensatory movement during digit abd -R pinch strengthening: pt used therapy resistant clothespins to clip pins on/off a vertical dowel, completing 2 trials for lateral (no black or blue pins) and 3 point pinch (no black).   -Issued small foam for pinch strengthening at home (encouraged pt keep in car or by tv); good return demo of pinch patterns -Grip and pinch strength remeasured; see above  Self Care: -Handwriting: Repetition with numbers, specifically 4 and 5 as pt feels he lacks control and fluidity with these, signing name  Trialed: Built up red foam, rubber grip with finger grooves, presented wide grip pen options online   PATIENT EDUCATION: Education details: HEP review; handwriting strategies/AE Person educated: Patient Education method: Software engineer Education comprehension: verbalized understanding and returned demonstration  HOME EXERCISE PROGRAM: Pink theraputty, cane stretches  GOALS: Goals reviewed with patient? Yes  SHORT TERM GOALS: Target date: 09/05/2023    Pt.will be independent with HEPs for the RUE, and hand Baseline: Eval: No current HEP Goal status: INITIAL   LONG TERM GOALS: Target date: 10/17/2023   Pt. Will increase RUE shoulder  ROM by 10 degrees to improve RUE functional reach. Baseline: Eval: Right shoulder flexion: 112(128), Abduction: 72(110)  Goal status: INITIAL  2.  Pt. Will  increase right grip strength by 5# to be able to securely turn a doorknob. Baseline: Eval: Right: 62#, Left: 83# Goal status: INITIAL  3.  Pt. Will improve right lateral pinch strength by 5# to assist with fork use during meals.  Baseline: Eval: Right: 7#, Left: 20# Goal status: INITIAL  4.  Pt. Will improve pinch strength by 3# to be able to open packets/packages Baseline: Eval: 3pt. Pinch: R: 6#, L: 11#; 2pt. Pinch: R: 7#, L: 13# Goal status: INITIAL  5.  Pt. Will improve right hand [facility] skills by 3 sec. To be able to button a shirt efficiently.  Baseline:  Eval: 9 hole peg test: Right: 33 sec., L: 28 sec. Goal status: INITIAL  6.  Pt. Will write a sentence legibly, and efficiently. Baseline: Eval: Name only: 75% legibility for printed form, 50% legibility for cursive form Goal status: INITIAL  ASSESSMENT:  CLINICAL IMPRESSION: Pt reports consistent use of theraputty over the weekend, but having to take a break from new shoulder stretches learned last session d/t tweaking his back over the weekend and having to lie on a heating pad.  OT issued additional smaller foam pieces for focusing on pinch strengthening throughout the day (more accessible than putty cup/encouraged keeping foam in car or by tv).  R grip strength is showing improvement.  3 trials with dynamometer today, with pt achieving up to 71 lbs,  improved from 62 lbs at eval.  Pt continues to present with weakness in the R thumb, and tends to compensate with IP flexion d/t lacking strength with the recruitment of other instrinsic thumb muscles (FPB, OP, AP), pt also presenting with continued difficulty with thumb opposition to 4th and 5th digits.  Not yet able to consistently manage blue clothespins, and unable to manage black, most resistive clothespins.  OT observed decreased fluidity with handwriting this date when using standard pen.  Pt verbalized R hand felt weak during writing, and did show improvement in  fluidity/control with wider grip.  Pt preferred rubber pen grip with finger grooves; issued for home.  Encouraged pt to utilize wider grip pens/pencils at home as hand strength continues to improve.  Pt. will continue to benefit from OT services to work on improving RUE functioning in order to increase RUE engagement in, and maximize performance during ADLs and IADL tasks.   PERFORMANCE DEFICITS: in functional skills including ADLs, IADLs, coordination, dexterity, proprioception, ROM, strength, pain, Fine motor control, Gross motor control, decreased knowledge of use of DME, and UE functional use, cognitive skills including , and psychosocial skills including environmental adaptation and routines and behaviors.   IMPAIRMENTS: are limiting patient from ADLs, IADLs, education, work, and leisure.   CO-MORBIDITIES: may have co-morbidities  that affects occupational performance. Patient will benefit from skilled OT to address above impairments and improve overall function.  MODIFICATION OR ASSISTANCE TO COMPLETE EVALUATION: Min-Moderate modification of tasks or assist with assess necessary to complete an evaluation.  OT OCCUPATIONAL PROFILE AND HISTORY: Detailed assessment: Review of records and additional review of physical, cognitive, psychosocial history related to current functional performance.  CLINICAL DECISION MAKING: Moderate -  several treatment options, min-mod task modification necessary  REHAB POTENTIAL: Good  EVALUATION COMPLEXITY: Moderate    PLAN:  OT FREQUENCY: 2x/week  OT DURATION: 12 weeks  PLANNED INTERVENTIONS: 97168 OT Re-evaluation, 97535 self care/ADL training, 19147 therapeutic exercise, 97530 therapeutic activity, 97112 neuromuscular re-education, 97140 manual therapy, 97018 paraffin, 82956 moist heat, 97034 contrast bath, 97033 iontophoresis, 97760 Splinting (initial encounter), [id number] Subsequent splinting/medication, passive range of motion, visual/perceptual remediation/compensation, patient/family education, and DME and/or AE instructions  RECOMMENDED OTHER SERVICES: PT  CONSULTED AND AGREED WITH PLAN OF CARE: Patient  PLAN FOR NEXT SESSION: review HEP, neck stretches, built up foam for gripping surfaces.    Marcus Sewer, MS, OTR/L  08/15/2023, 3:27 PM

## 2023-08-22 ENCOUNTER — Ambulatory Visit: Admitting: Dermatology

## 2023-08-22 ENCOUNTER — Ambulatory Visit

## 2023-08-22 ENCOUNTER — Encounter: Payer: Self-pay | Admitting: Dermatology

## 2023-08-22 DIAGNOSIS — L578 Other skin changes due to chronic exposure to nonionizing radiation: Secondary | ICD-10-CM | POA: Diagnosis not present

## 2023-08-22 DIAGNOSIS — R278 Other lack of coordination: Secondary | ICD-10-CM

## 2023-08-22 DIAGNOSIS — L570 Actinic keratosis: Secondary | ICD-10-CM | POA: Diagnosis not present

## 2023-08-22 DIAGNOSIS — Z872 Personal history of diseases of the skin and subcutaneous tissue: Secondary | ICD-10-CM | POA: Diagnosis not present

## 2023-08-22 DIAGNOSIS — L814 Other melanin hyperpigmentation: Secondary | ICD-10-CM

## 2023-08-22 DIAGNOSIS — Z1283 Encounter for screening for malignant neoplasm of skin: Secondary | ICD-10-CM

## 2023-08-22 DIAGNOSIS — W908XXA Exposure to other nonionizing radiation, initial encounter: Secondary | ICD-10-CM | POA: Diagnosis not present

## 2023-08-22 DIAGNOSIS — L918 Other hypertrophic disorders of the skin: Secondary | ICD-10-CM

## 2023-08-22 DIAGNOSIS — L821 Other seborrheic keratosis: Secondary | ICD-10-CM

## 2023-08-22 DIAGNOSIS — L820 Inflamed seborrheic keratosis: Secondary | ICD-10-CM

## 2023-08-22 DIAGNOSIS — D229 Melanocytic nevi, unspecified: Secondary | ICD-10-CM

## 2023-08-22 DIAGNOSIS — M6281 Muscle weakness (generalized): Secondary | ICD-10-CM

## 2023-08-22 DIAGNOSIS — D1801 Hemangioma of skin and subcutaneous tissue: Secondary | ICD-10-CM

## 2023-08-22 NOTE — Patient Instructions (Addendum)

## 2023-08-22 NOTE — Therapy (Signed)
 OUTPATIENT OCCUPATIONAL THERAPY NEURO TREATMENT  Patient Name: Dennis Barrera MRN: [id number] DOB:1950-12-06, 73 y.o., male Today's Date: 08/22/2023  PCP: Gustavo Leigh, MD REFERRING PROVIDER: Cullen Dose, MD  END OF SESSION:  OT End of Session - 08/22/23 1531     Visit Number 5    Number of Visits 24    Date for OT Re-Evaluation 10/17/23    Authorization Type Progress reporting period starting 07/25/23    OT Start Time 0930    OT Stop Time 1015    OT Time Calculation (min) 45 min    Activity Tolerance Patient tolerated treatment well    Behavior During Therapy [facility] for tasks assessed/performed            No past medical history on file. Past Surgical History:  Procedure Laterality Date   COLONOSCOPY WITH PROPOFOL  N/A 07/06/2016   Procedure: COLONOSCOPY WITH PROPOFOL ;  Surgeon: Luke Salaam, MD;  Location: [facility];  Service: Endoscopy;  Laterality: N/A;   CYST EXCISION  02/05/14   lower back   INGUINAL HERNIA REPAIR Left 01/02/2020   Procedure: HERNIA REPAIR INGUINAL ADULT;  Surgeon: Marshall Skeeter, MD;  Location: [facility] OR[facility];  Service: General;  Laterality: Left;   KNEE ARTHROSCOPY Left 06/16/2020   Procedure: ARTHROSCOPY KNEE LEFT;  Surgeon: Arlyne Lame, MD;  Location: [facility] OR[facility];  Service: Orthopedics;  Laterality: Left;   SHOULDER SURGERY     TONSILLECTOMY     WISDOM TOOTH EXTRACTION     Patient Active Problem List   Diagnosis Date Noted   Exposure to COVID-19 virus 05/05/2022   COVID-19 05/05/2022   Cervical spondylosis without myelopathy 10/24/2014   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   Pt accompanied by: self  PERTINENT HISTORY:  Pt. is a 73 y.o. male who was referred for OT services 2/2 decreased right hand strength, with decreased right hand dexterity over the past several months, as well as numbness and tingling in the Left. Pt. presents with Right thumb, and 2nd digit weakness with atrophy at the right 1st dorsal interossei, and thenar eminence. History of cervicalgia, 1/10 scapular pain radiating down to the Left elbow. (Of note Pt. reports approximately 15 years ago, waking up with a popping-like pain in the neck with immediate onset of 4th, and 5th digit numbness) Pt. Is currently undergoing work-up, and imaging. Pt. PMHx includes: Cervical spondylosis without myelopathy, Cervical nerve root disorder, Degeneration of cervical intervertebral disc, COVID-19, Sabaceous cyst  PRECAUTIONS: None  WEIGHT BEARING RESTRICTIONS: No  PAIN:  Are you having pain? Mild pain in back   FALLS: Has patient fallen in last 6 months? No  LIVING ENVIRONMENT: Lives with: lives with their family and lives alone Lives in: House/apartment Stairs: entry level Has following equipment at home: Tour manager, [facility]  PLOF: Independent  PATIENT GOALS:  To return to PLOF with strength, and dexterity  OBJECTIVE:  Note: Objective measures were completed at Evaluation unless otherwise noted.  HAND DOMINANCE: Right  ADLs:  Transfers/ambulation related to ADLs: Eating: Reports Clumsy using utensil,using a fork to spear food, Difficulty with cutting food. Difficulty  holding a larger glass Grooming: Adapted, uses left to pump lotion, and squeeze the end of the toothpaste. Brushes teeth with the right hand UB Dressing: decreased strength to hold zipper, fatigues with buttons, increased  time. LB Dressing: tying shoes-loose, unable if its cold. Independent donning  pants, socks. Difficulty with the belt, and pant button. Toileting: Independent Bathing: loses control on the soap Tub Shower transfers: Independent   IADLs: Shopping: more time adapted the Light housekeeping:  More difficulty with the fitted sheet, holding dishes. Meal Prep: Cutting  Community mobility: Independent Medication management:  uses left hand the opposite way to open Financial management: No changes, check writing takes longer Handwriting: 75% legible printing, 50% cursive Hobbies: Golfing, working in the yard, fixing the house Work:  MOBILITY STATUS: Independent  POSTURE COMMENTS:  No Significant postural limitations Sitting balance:  Intact  ACTIVITY TOLERANCE: Activity tolerance:  good  FUNCTIONAL OUTCOME MEASURES: MAM-20: 66/80  sum score: 63.3 MAM Measure score  UPPER EXTREMITY ROM:    Active ROM Right eval Left Eval [facility]  Shoulder flexion 112(128)   Shoulder abduction 72(110)   Shoulder adduction    Shoulder extension    Shoulder internal rotation    Shoulder external rotation    Elbow flexion WFL   Elbow extension WFL   Wrist flexion WFL   Wrist extension WFL   Wrist ulnar deviation    Wrist radial deviation    Wrist pronation    Wrist supination    (Blank rows = not tested)  Thumb opposition: Right:  to the tip of the 4th digit. Pt. Is able to make a full composite fist.  UPPER EXTREMITY MMT:     MMT Right eval Left Eval 5/5  Shoulder flexion 4-/5   Shoulder abduction 3-/5   Shoulder adduction    Shoulder extension    Shoulder internal rotation    Shoulder external rotation    Middle trapezius    Lower trapezius    Elbow flexion 5/5   Elbow extension 5/5   Wrist flexion 5/5   Wrist extension 5/5   Wrist ulnar deviation    Wrist radial deviation    Wrist pronation    Wrist supination    (Blank rows = not tested)  HAND FUNCTION: Eval: Grip strength: Right: 62 lbs; Left: 83 lbs, Lateral pinch: Right: 7 lbs, Left: 20 lbs, 3  point pinch: Right: 6 lbs, Left: 11 lbs, and Tip pinch: Right 7 lbs, Left: 13 lbs 08/15/23: Grip strength: Right: 63>71>60 lbs (3 trials), L 87 lbs, Lateral pinch: Right: 4 lbs (Saehan pinch gauge-standard used at eval, but not available today), Left 19 lbs; 3 point pinch: Right: 5 lbs, Left: 17 lbs, and tip pinch: Right: 5 lbs, Left: 7 lbs  COORDINATION: 9 Hole Peg test: Right: 33 sec; Left: 28 sec  SENSATION: Light touch: WFL Proprioception: WFL  EDEMA: N/A  MUSCLE TONE: atrophy at the right 1st dorsal interossei, thenar eminence  COGNITION: Overall cognitive status: Within functional limits for tasks assessed  VISION: Subjective report: No change from baseline  PERCEPTION: WFL  PRAXIS: [facility]  TREATMENT DATE: 08/22/2023  Therapeutic Exercises:   -Review of pink theraputty exercises with good return demo -R pinch strengthening: pt used therapy resistant clothespins to clip pins on/off a vertical dowel, completing 2 trials for lateral (no black or blue pins), 3 point pinch (no black), and tip pinch (no black) -R grip strengthening with hand gripper set at 17.9# for 1st trial, progressing to 23.4# for 2nd and 3rd trial to remove jumbo pegs from pegboard.   -Instructed in passive wrist and digit ext stretch with elbow extended following grip strengthening noted above -Issued yellow theraband and instructed in R shoulder strengthening for R shoulder horiz abd, shoulder flex, ER with elbows at sides, and shoulder abd; min vc for form/technique (visual handout issued and reviewed for carryover at home.   PATIENT EDUCATION: Education details: HEP progression with yellow theraband Person educated: Patient Education method: Medical illustrator Education comprehension: verbalized understanding and returned demonstration  HOME EXERCISE PROGRAM: Pink theraputty,  cane stretches  GOALS: Goals reviewed with patient? Yes  SHORT TERM GOALS: Target date: 09/05/2023    Pt.will be independent with HEPs for the RUE, and hand Baseline: Eval: No current HEP Goal status: INITIAL   LONG TERM GOALS: Target date: 10/17/2023   Pt. Will increase RUE shoulder  ROM by 10 degrees to improve RUE functional reach. Baseline: Eval: Right shoulder flexion: 112(128), Abduction: 72(110)  Goal status: INITIAL  2.  Pt. Will  increase right grip strength by 5# to be able to securely turn a doorknob. Baseline: Eval: Right: 62#, Left: 83# Goal status: INITIAL  3.  Pt. Will improve right lateral pinch strength by 5# to assist with fork use during meals.  Baseline: Eval: Right: 7#, Left: 20# Goal status: INITIAL  4.  Pt. Will improve pinch strength by 3# to be able to open packets/packages Baseline: Eval: 3pt. Pinch: R: 6#, L: 11#; 2pt. Pinch: R: 7#, L: 13# Goal status: INITIAL  5.  Pt. Will improve right hand [facility] skills by 3 sec. To be able to button a shirt efficiently.  Baseline:  Eval: 9 hole peg test: Right: 33 sec., L: 28 sec. Goal status: INITIAL  6.  Pt. Will write a sentence legibly, and efficiently. Baseline: Eval: Name only: 75% legibility for printed form, 50% legibility for cursive form Goal status: INITIAL  ASSESSMENT:  CLINICAL IMPRESSION: Pt acknowledges some visible improvement in tolerance for theraputty exercises, specifically forming a lateral pinch.  Pt with good tolerance to above noted therapeutic exercises.  Progressed HEP to include R shoulder strengthening with yellow theraband for above noted planes; min vc for form/technique.  Pt. will continue to benefit from OT services to work on improving RUE functioning in order to increase RUE engagement in, and maximize performance during ADLs and IADL tasks.   PERFORMANCE DEFICITS: in functional skills including ADLs, IADLs, coordination, dexterity, proprioception, ROM, strength, pain, Fine motor  control, Gross motor control, decreased knowledge of use of DME, and UE functional use, cognitive skills including , and psychosocial skills including environmental adaptation and routines and behaviors.   IMPAIRMENTS: are limiting patient from ADLs, IADLs, education, work, and leisure.   CO-MORBIDITIES: may have co-morbidities  that affects occupational performance. Patient will benefit from skilled OT to address above impairments and improve overall function.  MODIFICATION OR ASSISTANCE TO COMPLETE EVALUATION: Min-Moderate modification of tasks or assist with assess necessary to complete an evaluation.  OT OCCUPATIONAL PROFILE AND HISTORY: Detailed assessment: Review of records and additional review of physical, cognitive, psychosocial history  related to current functional performance.  CLINICAL DECISION MAKING: Moderate - several treatment options, min-mod task modification necessary  REHAB POTENTIAL: Good  EVALUATION COMPLEXITY: Moderate    PLAN:  OT FREQUENCY: 2x/week  OT DURATION: 12 weeks  PLANNED INTERVENTIONS: 97168 OT Re-evaluation, 97535 self care/ADL training, 04540 therapeutic exercise, 97530 therapeutic activity, 97112 neuromuscular re-education, 97140 manual therapy, 97018 paraffin, 98119 moist heat, 97034 contrast bath, 97033 iontophoresis, 97760 Splinting (initial encounter), [id number] Subsequent splinting/medication, passive range of motion, visual/perceptual remediation/compensation, patient/family education, and DME and/or AE instructions  RECOMMENDED OTHER SERVICES: PT  CONSULTED AND AGREED WITH PLAN OF CARE: Patient  PLAN FOR NEXT SESSION: review HEP  Marcus Sewer, MS, OTR/L  08/22/2023, 3:32 PM

## 2023-08-22 NOTE — Progress Notes (Signed)
 Follow-Up Visit   Subjective  Dennis Barrera is a 73 y.o. male who presents for the following: Skin Cancer Screening and Full Body Skin Exam Hx of aks and isks, spots at left corner eye that are irritated.   The patient presents for Total-Body Skin Exam (TBSE) for skin cancer screening and mole check. The patient has spots, moles and lesions to be evaluated, some may be new or changing and the patient may have concern these could be cancer.   The following portions of the chart were reviewed this encounter and updated as appropriate: medications, allergies, medical history  Review of Systems:  No other skin or systemic complaints except as noted in HPI or Assessment and Plan.  Objective  Well appearing patient in no apparent distress; mood and affect are within normal limits.  A full examination was performed including scalp, head, eyes, ears, nose, lips, neck, chest, axillae, abdomen, back, buttocks, bilateral upper extremities, bilateral lower extremities, hands, feet, fingers, toes, fingernails, and toenails. All findings within normal limits unless otherwise noted below.   Relevant physical exam findings are noted in the Assessment and Plan.  Left Medial Canthus infraorbital  x 2 (2) Erythematous stuck-on, waxy papule or plaque face and scalp x 5 (5) Erythematous thin papules/macules with gritty scale.   Assessment & Plan   SKIN CANCER SCREENING PERFORMED TODAY.  ACTINIC DAMAGE - Chronic condition, secondary to cumulative UV/sun exposure - diffuse scaly erythematous macules with underlying dyspigmentation - Recommend daily broad spectrum sunscreen SPF 30+ to sun-exposed areas, reapply every 2 hours as needed.  - Staying in the shade or wearing long sleeves, sun glasses (UVA+UVB protection) and wide brim hats (4-inch brim around the entire circumference of the hat) are also recommended for sun protection.  - Call for new or changing lesions.  LENTIGINES, SEBORRHEIC  KERATOSES, HEMANGIOMAS - Benign normal skin lesions - Benign-appearing - Call for any changes  MELANOCYTIC NEVI - Tan-brown and/or pink-flesh-colored symmetric macules and papules - Benign appearing on exam today - Observation - Call clinic for new or changing moles - Recommend daily use of broad spectrum spf 30+ sunscreen to sun-exposed areas.   Acrochordons (Skin Tags) - Fleshy, skin-colored pedunculated papules - Benign appearing.  - Observe. - If desired, they can be removed with an in office procedure that is not covered by insurance. - Please call the clinic if you notice any new or changing lesions.  INFLAMED SEBORRHEIC KERATOSIS (2) Left Medial Canthus infraorbital  x 2 (2) Symptomatic, irritating, patient would like treated. Destruction of lesion - Left Medial Canthus infraorbital  x 2 (2) Complexity: simple   Destruction method: cryotherapy   Informed consent: discussed and consent obtained   Timeout:  patient name, date of birth, surgical site, and procedure verified Lesion destroyed using liquid nitrogen: Yes   Region frozen until ice ball extended beyond lesion: Yes   Outcome: patient tolerated procedure well with no complications   Post-procedure details: wound care instructions given   ACTINIC KERATOSIS (5) face and scalp x 5 (5) Actinic keratoses are precancerous spots that appear secondary to cumulative UV radiation exposure/sun exposure over time. They are chronic with expected duration over 1 year. A portion of actinic keratoses will progress to squamous cell carcinoma of the skin. It is not possible to reliably predict which spots will progress to skin cancer and so treatment is recommended to prevent development of skin cancer.  Recommend daily broad spectrum sunscreen SPF 30+ to sun-exposed areas, reapply every 2  hours as needed.  Recommend staying in the shade or wearing long sleeves, sun glasses (UVA+UVB protection) and wide brim hats (4-inch brim around the  entire circumference of the hat). Call for new or changing lesions. Destruction of lesion - face and scalp x 5 (5) Complexity: simple   Destruction method: cryotherapy   Informed consent: discussed and consent obtained   Timeout:  patient name, date of birth, surgical site, and procedure verified Lesion destroyed using liquid nitrogen: Yes   Region frozen until ice ball extended beyond lesion: Yes   Outcome: patient tolerated procedure well with no complications   Post-procedure details: wound care instructions given   Return in about 1 year (around 08/21/2024) for TBSE. IRandee Busing, CMA, am acting as scribe for Celine Collard, MD.   Documentation: I have reviewed the above documentation for accuracy and completeness, and I agree with the above.  Celine Collard, MD

## 2023-08-29 ENCOUNTER — Ambulatory Visit

## 2023-08-29 DIAGNOSIS — R278 Other lack of coordination: Secondary | ICD-10-CM | POA: Diagnosis not present

## 2023-08-29 DIAGNOSIS — M6281 Muscle weakness (generalized): Secondary | ICD-10-CM | POA: Diagnosis not present

## 2023-08-29 NOTE — Therapy (Signed)
 OUTPATIENT OCCUPATIONAL THERAPY NEURO TREATMENT  Patient Name: Dennis Barrera MRN: [id number] DOB:1950/11/27, 73 y.o., male Today's Date: 08/29/2023  PCP: Gustavo Leigh, MD REFERRING PROVIDER: Cullen Dose, MD  END OF SESSION:  OT End of Session - 08/29/23 1546     Visit Number 6    Number of Visits 24    Date for OT Re-Evaluation 10/17/23    Authorization Type Progress reporting period starting 07/25/23    OT Start Time 0930    OT Stop Time 1015    OT Time Calculation (min) 45 min    Activity Tolerance Patient tolerated treatment well    Behavior During Therapy [facility] for tasks assessed/performed             No past medical history on file. Past Surgical History:  Procedure Laterality Date   COLONOSCOPY WITH PROPOFOL  N/A 07/06/2016   Procedure: COLONOSCOPY WITH PROPOFOL ;  Surgeon: Luke Salaam, MD;  Location: [facility] ENDOSCOPY;  Service: Endoscopy;  Laterality: N/A;   CYST EXCISION  02/05/14   lower back   INGUINAL HERNIA REPAIR Left 01/02/2020   Procedure: HERNIA REPAIR INGUINAL ADULT;  Surgeon: Marshall Skeeter, MD;  Location: [facility] ORS;  Service: General;  Laterality: Left;   KNEE ARTHROSCOPY Left 06/16/2020   Procedure: ARTHROSCOPY KNEE LEFT;  Surgeon: Arlyne Lame, MD;  Location: [facility] ORS;  Service: Orthopedics;  Laterality: Left;   SHOULDER SURGERY     TONSILLECTOMY     WISDOM TOOTH EXTRACTION     Patient Active Problem List   Diagnosis Date Noted   Exposure to COVID-19 virus 05/05/2022   COVID-19 05/05/2022   Cervical spondylosis without myelopathy 10/24/2014   Cervical nerve root disorder 10/24/2014   Degeneration of cervical intervertebral disc 10/24/2014   Sebaceous cyst 02/06/2014   ONSET DATE: 03/03/2023  REFERRING DIAG: Cervicalgia, numbness/tingling, RUE weakness, decreased dexterity  THERAPY DIAG:  Muscle weakness (generalized)  Other lack of coordination  Rationale for Evaluation and Treatment: Rehabilitation  SUBJECTIVE: SUBJECTIVE  STATEMENT: Pt reports doing well today.  Mild shoulder tightness/discomfort after playing a lot of golf this weekend, but overall feeling a little less stiff when swinging his club.   Pt accompanied by: self  PERTINENT HISTORY:  Pt. is a 72 y.o. male who was referred for OT services 2/2 decreased right hand strength, with decreased right hand dexterity over the past several months, as well as numbness and tingling in the Left. Pt. presents with Right thumb, and 2nd digit weakness with atrophy at the right 1st dorsal interossei, and thenar eminence. History of cervicalgia, 1/10 scapular pain radiating down to the Left elbow. (Of note Pt. reports approximately 15 years ago, waking up with a popping-like pain in the neck with immediate onset of 4th, and 5th digit numbness) Pt. Is currently undergoing work-up, and imaging. Pt. PMHx includes: Cervical spondylosis without myelopathy, Cervical nerve root disorder, Degeneration of cervical intervertebral disc, COVID-19, Sabaceous cyst  PRECAUTIONS: None  WEIGHT BEARING RESTRICTIONS: No  PAIN: R shoulder tightness Are you having pain? Mild pain in back   FALLS: Has patient fallen in last 6 months? 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  HAND DOMINANCE: Right  ADLs:  Transfers/ambulation related to ADLs: Eating: Reports Clumsy  using utensil,using a fork to spear food, Difficulty with cutting food. Difficulty holding a larger glass Grooming: Adapted, uses left to pump lotion, and squeeze the end of the toothpaste. Brushes teeth with the right hand UB Dressing: decreased strength to hold zipper, fatigues with buttons, increased  time. LB  Dressing: tying shoes-loose, unable if its cold. Independent donning pants, socks. Difficulty with the belt, and pant button. Toileting: Independent Bathing: loses control on the soap Tub Shower transfers: Independent   IADLs: Shopping: more time adapted the Light housekeeping:  More difficulty with the fitted sheet, holding dishes. Meal Prep: Cutting  Community mobility: Independent Medication management:  uses left hand the opposite way to open Financial management: No changes, check writing takes longer Handwriting: 75% legible printing, 50% cursive Hobbies: Golfing, working in the yard, fixing the house Work:  MOBILITY STATUS: Independent  POSTURE COMMENTS:  No Significant postural limitations Sitting balance:  Intact  ACTIVITY TOLERANCE: Activity tolerance:  good  FUNCTIONAL OUTCOME MEASURES: MAM-20: 66/80  sum score: 63.3 MAM Measure score  UPPER EXTREMITY ROM:    Active ROM Right eval Left Eval [facility]  Shoulder flexion 112(128)   Shoulder abduction 72(110)   Shoulder adduction    Shoulder extension    Shoulder internal rotation    Shoulder external rotation    Elbow flexion WFL   Elbow extension WFL   Wrist flexion WFL   Wrist extension WFL   Wrist ulnar deviation    Wrist radial deviation    Wrist pronation    Wrist supination    (Blank rows = not tested)  Thumb opposition: Right:  to the tip of the 4th digit. Pt. Is able to make a full composite fist.  UPPER EXTREMITY MMT:     MMT Right eval Left Eval 5/5  Shoulder flexion 4-/5   Shoulder abduction 3-/5   Shoulder adduction    Shoulder extension    Shoulder internal rotation    Shoulder external rotation    Middle trapezius    Lower trapezius    Elbow flexion 5/5   Elbow extension 5/5   Wrist flexion 5/5   Wrist extension 5/5   Wrist ulnar deviation    Wrist radial deviation    Wrist pronation    Wrist supination    (Blank rows = not tested)  HAND FUNCTION: Eval: Grip strength:  Right: 62 lbs; Left: 83 lbs, Lateral pinch: Right: 7 lbs, Left: 20 lbs, 3 point pinch: Right: 6 lbs, Left: 11 lbs, and Tip pinch: Right 7 lbs, Left: 13 lbs 08/15/23: Grip strength: Right: 63>71>60 lbs (3 trials), L 87 lbs, Lateral pinch: Right: 4 lbs (Saehan pinch gauge-standard used at eval, but not available today), Left 19 lbs; 3 point pinch: Right: 5 lbs, Left: 17 lbs, and tip pinch: Right: 5 lbs, Left: 7 lbs  COORDINATION: 9 Hole Peg test: Right: 33 sec; Left: 28 sec  SENSATION: Light touch: WFL Proprioception: WFL  EDEMA: N/A  MUSCLE TONE: atrophy at the right 1st dorsal interossei, thenar eminence  COGNITION: Overall cognitive status: Within functional limits for tasks assessed  VISION: Subjective report: No change from baseline  PERCEPTION: WFL  PRAXIS: [facility]  TREATMENT DATE: 08/29/2023  Therapeutic Exercises:   -Pink theraputty exercises for lateral pinch, 3 point pinch, digit opposition, thumb flexion with good maintenance of form/accurate prehension patterns x10 reps each -R pinch strengthening: pt used therapy resistant clothespins to clip pins on/off a vertical dowel, completing 2 trials for lateral and 3 point pinch with yellow, red, and green clips -R grip strengthening with hand gripper set at 23.4# for 2 trials, progressing to 28.9# for a 3rd trial to remove jumbo pegs from pegboard.   -Passive wrist and digit ext stretch with elbow extended following grip strengthening noted above -Review of yellow theraband exercises for R shoulder strengthening for R shoulder horiz abd, shoulder flex, ER with elbows at sides; min vc for positioning, and shoulder abd x10 reps each  Therapeutic Activity: Facilitated R hand FMC/dexterity skills: Placement of Jamar pegs 1 row without tweezers, 2 rows with narrow base tweezers to facilitate sustained lateral pinch,  removed all 3 rows with wide base resistive tweezers; min vc to position R forearm on table top to maximize distal stability d/t mild distal trembling (likely muscle fatigue).   PATIENT EDUCATION: Education details: FMC exercises Person educated: Patient Education method: Handouts Education comprehension: verbalized understanding  HOME EXERCISE PROGRAM: Pink theraputty, cane stretches, yellow theraband, FMC exercise handout  GOALS: Goals reviewed with patient? Yes  SHORT TERM GOALS: Target date: 09/05/2023    Pt.will be independent with HEPs for the RUE, and hand Baseline: Eval: No current HEP Goal status: INITIAL   LONG TERM GOALS: Target date: 10/17/2023   Pt. Will increase RUE shoulder  ROM by 10 degrees to improve RUE functional reach. Baseline: Eval: Right shoulder flexion: 112(128), Abduction: 72(110)  Goal status: INITIAL  2.  Pt. Will  increase right grip strength by 5# to be able to securely turn a doorknob. Baseline: Eval: Right: 62#, Left: 83# Goal status: INITIAL  3.  Pt. Will improve right lateral pinch strength by 5# to assist with fork use during meals.  Baseline: Eval: Right: 7#, Left: 20# Goal status: INITIAL  4.  Pt. Will improve pinch strength by 3# to be able to open packets/packages Baseline: Eval: 3pt. Pinch: R: 6#, L: 11#; 2pt. Pinch: R: 7#, L: 13# Goal status: INITIAL  5.  Pt. Will improve right hand [facility] skills by 3 sec. To be able to button a shirt efficiently.  Baseline:  Eval: 9 hole peg test: Right: 33 sec., L: 28 sec. Goal status: INITIAL  6.  Pt. Will write a sentence legibly, and efficiently. Baseline: Eval: Name only: 75% legibility for printed form, 50% legibility for cursive form Goal status: INITIAL  ASSESSMENT:  CLINICAL IMPRESSION: Pt making steady gains with R hand strength, noting good ability to progress with hand gripper today from 23.4# for 2 sets to 28.9# for a 3rd set.  Mild trembling in R hand noted with [facility] exercises,  though likely muscle fatigue from high reps of resistance exercise today and weekend of golfing.  Pt reports improvements in handwriting using built up foam grip, but still lacking some dexterity skills and ongoing weakness in the R hand.  Issued functional [facility] exercise handout for pt to target FMC/dexterity skills as part of HEP.  Pt. will continue to benefit from OT services to work on improving RUE functioning in order to increase RUE engagement in, and maximize performance during ADLs and IADL tasks.   PERFORMANCE DEFICITS: in functional skills including ADLs, IADLs, coordination, dexterity, proprioception, ROM, strength, pain, Fine motor control, Gross motor  control, decreased knowledge of use of DME, and UE functional use, cognitive skills including , and psychosocial skills including environmental adaptation and routines and behaviors.   IMPAIRMENTS: are limiting patient from ADLs, IADLs, education, work, and leisure.   CO-MORBIDITIES: may have co-morbidities  that affects occupational performance. Patient will benefit from skilled OT to address above impairments and improve overall function.  MODIFICATION OR ASSISTANCE TO COMPLETE EVALUATION: Min-Moderate modification of tasks or assist with assess necessary to complete an evaluation.  OT OCCUPATIONAL PROFILE AND HISTORY: Detailed assessment: Review of records and additional review of physical, cognitive, psychosocial history related to current functional performance.  CLINICAL DECISION MAKING: Moderate - several treatment options, min-mod task modification necessary  REHAB POTENTIAL: Good  EVALUATION COMPLEXITY: Moderate    PLAN:  OT FREQUENCY: 2x/week  OT DURATION: 12 weeks  PLANNED INTERVENTIONS: 97168 OT Re-evaluation, 97535 self care/ADL training, 16109 therapeutic exercise, 97530 therapeutic activity, 97112 neuromuscular re-education, 97140 manual therapy, 97018 paraffin, 60454 moist heat, 97034 contrast bath, 97033  iontophoresis, 97760 Splinting (initial encounter), [id number] Subsequent splinting/medication, passive range of motion, visual/perceptual remediation/compensation, patient/family education, and DME and/or AE instructions  RECOMMENDED OTHER SERVICES: PT  CONSULTED AND AGREED WITH PLAN OF CARE: Patient  PLAN FOR NEXT SESSION: review HEP  Marcus Sewer, MS, OTR/L  08/29/2023, 3:47 PM

## 2023-09-05 ENCOUNTER — Ambulatory Visit: Attending: Neurology

## 2023-09-05 DIAGNOSIS — M6281 Muscle weakness (generalized): Secondary | ICD-10-CM

## 2023-09-05 DIAGNOSIS — R278 Other lack of coordination: Secondary | ICD-10-CM | POA: Diagnosis not present

## 2023-09-05 NOTE — Therapy (Signed)
 OUTPATIENT OCCUPATIONAL THERAPY NEURO TREATMENT  Patient Name: Dennis Barrera MRN: [id number] DOB:[birth year], 73 y.o., male Today's Date: 09/05/2023  PCP: Gustavo Leigh, MD REFERRING PROVIDER: Cullen Dose, MD  END OF SESSION:  OT End of Session - 09/05/23 1001     Visit Number 7    Number of Visits 24    Date for OT Re-Evaluation 10/17/23    Authorization Type Progress reporting period starting 07/25/23    OT Start Time 0845    OT Stop Time 0930    OT Time Calculation (min) 45 min    Activity Tolerance Patient tolerated treatment well    Behavior During Therapy [facility] for tasks assessed/performed             No past medical history on file. Past Surgical History:  Procedure Laterality Date   COLONOSCOPY WITH PROPOFOL  N/A 07/06/2016   Procedure: COLONOSCOPY WITH PROPOFOL ;  Surgeon: Luke Salaam, MD;  Location: [facility] ENDOSCOPY;  Service: Endoscopy;  Laterality: N/A;   CYST EXCISION  02/05/14   lower back   INGUINAL HERNIA REPAIR Left 01/02/2020   Procedure: HERNIA REPAIR INGUINAL ADULT;  Surgeon: Marshall Skeeter, MD;  Location: [facility] ORS;  Service: General;  Laterality: Left;   KNEE ARTHROSCOPY Left 06/16/2020   Procedure: ARTHROSCOPY KNEE LEFT;  Surgeon: Arlyne Lame, MD;  Location: [facility] ORS;  Service: Orthopedics;  Laterality: Left;   SHOULDER SURGERY     TONSILLECTOMY     WISDOM TOOTH EXTRACTION     Patient Active Problem List   Diagnosis Date Noted   Exposure to COVID-19 virus 05/05/2022   COVID-19 05/05/2022   Cervical spondylosis without myelopathy 10/24/2014   Cervical nerve root disorder 10/24/2014   Degeneration of cervical intervertebral disc 10/24/2014   Sebaceous cyst 02/06/2014   ONSET DATE: 03/03/2023  REFERRING DIAG: Cervicalgia, numbness/tingling, RUE weakness, decreased dexterity  THERAPY DIAG:  Muscle weakness (generalized)  Other lack of coordination  Rationale for Evaluation and Treatment: Rehabilitation  SUBJECTIVE: SUBJECTIVE  STATEMENT: Pt reports some noticeable improvements with his typing, as well as some improvements in the tingling in the R/L hand median nerve digits.  Pt accompanied by: self  PERTINENT HISTORY:  Pt. is a 73 y.o. male who was referred for OT services 2/2 decreased right hand strength, with decreased right hand dexterity over the past several months, as well as numbness and tingling in the Left. Pt. presents with Right thumb, and 2nd digit weakness with atrophy at the right 1st dorsal interossei, and thenar eminence. History of cervicalgia, 1/10 scapular pain radiating down to the Left elbow. (Of note Pt. reports approximately 15 years ago, waking up with a popping-like pain in the neck with immediate onset of 4th, and 5th digit numbness) Pt. Is currently undergoing work-up, and imaging. Pt. PMHx includes: Cervical spondylosis without myelopathy, Cervical nerve root disorder, Degeneration of cervical intervertebral disc, COVID-19, Sabaceous cyst  PRECAUTIONS: None  WEIGHT BEARING RESTRICTIONS: No  PAIN: 09/05/23: mild soreness in the R shoulder with resistance exercises Are you having pain? Mild pain in back   FALLS: Has patient fallen in last 6 months? 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  HAND DOMINANCE: Right  ADLs: Transfers/ambulation related to ADLs: Eating: Reports Clumsy  using utensil,using a fork to spear food, Difficulty with cutting food. Difficulty holding a larger glass Grooming: Adapted, uses left to pump lotion, and squeeze the end of the toothpaste. Brushes teeth with the right hand UB Dressing: decreased strength to hold zipper, fatigues with buttons, increased   time. LB Dressing: tying shoes-loose, unable if its cold. Independent donning pants, socks. Difficulty with the belt, and pant button. Toileting: Independent Bathing: loses control on the soap Tub Shower transfers: Independent  IADLs: Shopping: more time adapted the Light housekeeping:  More difficulty with the fitted sheet, holding dishes. Meal Prep: Cutting  Community mobility: Independent Medication management:  uses left hand the opposite way to open Financial management: No changes, check writing takes longer Handwriting: 75% legible printing, 50% cursive Hobbies: Golfing, working in the yard, fixing the house Work:  MOBILITY STATUS: Independent  POSTURE COMMENTS:  No Significant postural limitations Sitting balance:  Intact  ACTIVITY TOLERANCE: Activity tolerance:  good  FUNCTIONAL OUTCOME MEASURES: MAM-20: 66/80  sum score: 63.3 MAM Measure score  UPPER EXTREMITY ROM:    Active ROM Right eval Left Eval [facility]  Shoulder flexion 112(128)   Shoulder abduction 72(110)   Shoulder adduction    Shoulder extension    Shoulder internal rotation    Shoulder external rotation    Elbow flexion WFL   Elbow extension WFL   Wrist flexion WFL   Wrist extension WFL   Wrist ulnar deviation    Wrist radial deviation    Wrist pronation    Wrist supination    (Blank rows = not tested)  Thumb opposition: Right:  to the tip of the 4th digit. Pt. Is able to make a full composite fist.  UPPER EXTREMITY MMT:     MMT Right eval Left Eval 5/5  Shoulder flexion 4-/5   Shoulder abduction 3-/5   Shoulder adduction    Shoulder extension    Shoulder internal rotation    Shoulder external rotation    Middle trapezius    Lower trapezius    Elbow flexion 5/5   Elbow extension 5/5   Wrist flexion 5/5   Wrist extension 5/5   Wrist ulnar deviation    Wrist radial deviation    Wrist pronation    Wrist supination    (Blank rows = not tested)  HAND FUNCTION: Eval: Grip  strength: Right: 62 lbs; Left: 83 lbs, Lateral pinch: Right: 7 lbs, Left: 20 lbs, 3 point pinch: Right: 6 lbs, Left: 11 lbs, and Tip pinch: Right 7 lbs, Left: 13 lbs 08/15/23: Grip strength: Right: 63>71>60 lbs (3 trials), L 87 lbs, Lateral pinch: Right: 4 lbs (Saehan pinch gauge-standard used at eval, but not available today), Left 19 lbs; 3 point pinch: Right: 5 lbs, Left: 17 lbs, and tip pinch: Right: 5 lbs, Left: 7 lbs  COORDINATION: 9 Hole Peg test: Right: 33 sec; Left: 28 sec  SENSATION: Light touch: WFL Proprioception: WFL  EDEMA: N/A  MUSCLE TONE: atrophy at the right 1st dorsal interossei, thenar eminence  COGNITION: Overall cognitive status: Within functional limits for tasks assessed  VISION: Subjective report: No change from baseline  PERCEPTION: WFL  PRAXIS: [facility]  TREATMENT DATE: 09/05/2023  Therapeutic Exercises:   -R grip strengthening with hand gripper set at 23.4# for 2 trials, progressing to 28.9# for a 3rd trial to remove jumbo pegs from pegboard.   -R pinch strengthening: pt used therapy resistant clothespins to clip pins on/off a vertical dowel, completing 2 trials for lateral and 3 point pinch with green, blue, and black pins only (moderate to strong resistance)  -Passive wrist and digit ext stretch with elbow extended following grip strengthening noted above  Therapeutic Activity: Facilitated R hand FMC/dexterity skills:  -Small button manipulation (without non-skid mat); performed pick up, storage and translatory skills working to pick up 5 buttons 1 by 1 and move buttons from palm to fingertips in prep for discarding buttons 1 by 1 into narrow container. -Practiced same skills as above (pick up/storage/translatory skills) with construction and removal of Purdue Pegboard pieces -Utilized wide based resistive tweezers and narrow based tweezers  (no resistance) to pick up 5 mm beads to drop into narrow container.     PATIENT EDUCATION: Education details: FMC exercises Person educated: Patient Education method: Handouts Education comprehension: verbalized understanding  HOME EXERCISE PROGRAM: Pink theraputty, cane stretches, yellow theraband, FMC exercise handout  GOALS: Goals reviewed with patient? Yes  SHORT TERM GOALS: Target date: 09/05/2023    Pt.will be independent with HEPs for the RUE, and hand Baseline: Eval: No current HEP Goal status: INITIAL   LONG TERM GOALS: Target date: 10/17/2023   Pt. Will increase RUE shoulder  ROM by 10 degrees to improve RUE functional reach. Baseline: Eval: Right shoulder flexion: 112(128), Abduction: 72(110)  Goal status: INITIAL  2.  Pt. Will  increase right grip strength by 5# to be able to securely turn a doorknob. Baseline: Eval: Right: 62#, Left: 83# Goal status: INITIAL  3.  Pt. Will improve right lateral pinch strength by 5# to assist with fork use during meals.  Baseline: Eval: Right: 7#, Left: 20# Goal status: INITIAL  4.  Pt. Will improve pinch strength by 3# to be able to open packets/packages Baseline: Eval: 3pt. Pinch: R: 6#, L: 11#; 2pt. Pinch: R: 7#, L: 13# Goal status: INITIAL  5.  Pt. Will improve right hand [facility] skills by 3 sec. To be able to button a shirt efficiently.  Baseline:  Eval: 9 hole peg test: Right: 33 sec., L: 28 sec. Goal status: INITIAL  6.  Pt. Will write a sentence legibly, and efficiently. Baseline: Eval: Name only: 75% legibility for printed form, 50% legibility for cursive form Goal status: INITIAL  ASSESSMENT:  CLINICAL IMPRESSION: Pt reports some noticeable functional improvements with his typing, as well as some improvements in the tingling in the R/L hand median nerve digits, though most noticeable improvements in the tingling of the L hand, per pt.  Pt reports more consistent ability to efficiently hit the space bar with his R  thumb when typing, allowing fewer typos with work related typing tasks.  Pt reports that he worked on manipulation of glass beads over the weekend using R hand and acknowledges continued struggles with FMC/dexterity skills.  Pt continues to make steady gains with R hand strength, noting ability to manage blue and black most resistive clothespins today using a lateral and a 3 point pinch pattern.  Pt presented with mild tremor in R hand near end of session after high reps of gripping and pinching exercises, but appeared to diminish following rest breaks.  Pt. will continue to benefit from OT services to work on improving  RUE functioning in order to increase RUE engagement in, and maximize performance during ADLs and IADL tasks.   PERFORMANCE DEFICITS: in functional skills including ADLs, IADLs, coordination, dexterity, proprioception, ROM, strength, pain, Fine motor control, Gross motor control, decreased knowledge of use of DME, and UE functional use, cognitive skills including , and psychosocial skills including environmental adaptation and routines and behaviors.   IMPAIRMENTS: are limiting patient from ADLs, IADLs, education, work, and leisure.   CO-MORBIDITIES: may have co-morbidities  that affects occupational performance. Patient will benefit from skilled OT to address above impairments and improve overall function.  MODIFICATION OR ASSISTANCE TO COMPLETE EVALUATION: Min-Moderate modification of tasks or assist with assess necessary to complete an evaluation.  OT OCCUPATIONAL PROFILE AND HISTORY: Detailed assessment: Review of records and additional review of physical, cognitive, psychosocial history related to current functional performance.  CLINICAL DECISION MAKING: Moderate - several treatment options, min-mod task modification necessary  REHAB POTENTIAL: Good  EVALUATION COMPLEXITY: Moderate    PLAN:  OT FREQUENCY: 2x/week  OT DURATION: 12 weeks  PLANNED INTERVENTIONS: 97168 OT  Re-evaluation, 97535 self care/ADL training, 65784 therapeutic exercise, 97530 therapeutic activity, 97112 neuromuscular re-education, 97140 manual therapy, 97018 paraffin, 69629 moist heat, 97034 contrast bath, 97033 iontophoresis, 97760 Splinting (initial encounter), [id number] Subsequent splinting/medication, passive range of motion, visual/perceptual remediation/compensation, patient/family education, and DME and/or AE instructions  RECOMMENDED OTHER SERVICES: PT  CONSULTED AND AGREED WITH PLAN OF CARE: Patient  PLAN FOR NEXT SESSION: review HEP  Marcus Sewer, MS, OTR/L  09/05/2023, 10:02 AM

## 2023-09-12 ENCOUNTER — Ambulatory Visit

## 2023-09-14 ENCOUNTER — Ambulatory Visit

## 2023-09-14 DIAGNOSIS — R278 Other lack of coordination: Secondary | ICD-10-CM | POA: Diagnosis not present

## 2023-09-14 DIAGNOSIS — M6281 Muscle weakness (generalized): Secondary | ICD-10-CM

## 2023-09-14 NOTE — Therapy (Addendum)
 OUTPATIENT OCCUPATIONAL THERAPY NEURO TREATMENT  Patient Name: Dennis Barrera MRN: [id number] DOB:November 15, 1950, 73 y.o., male Today's Date: 09/14/2023  PCP: Gustavo Leigh, MD REFERRING PROVIDER: Cullen Dose, MD  END OF SESSION:  OT End of Session - 09/14/23 0758     Visit Number 8    Number of Visits 24    Date for OT Re-Evaluation 10/17/23    Authorization Type Progress reporting period starting 07/25/23    OT Start Time 0800    OT Stop Time 0845    OT Time Calculation (min) 45 min    Activity Tolerance Patient tolerated treatment well    Behavior During Therapy [facility] for tasks assessed/performed             No past medical history on file. Past Surgical History:  Procedure Laterality Date   COLONOSCOPY WITH PROPOFOL  N/A 07/06/2016   Procedure: COLONOSCOPY WITH PROPOFOL ;  Surgeon: Luke Salaam, MD;  Location: [facility] ENDOSCOPY;  Service: Endoscopy;  Laterality: N/A;   CYST EXCISION  02/05/14   lower back   INGUINAL HERNIA REPAIR Left 01/02/2020   Procedure: HERNIA REPAIR INGUINAL ADULT;  Surgeon: Marshall Skeeter, MD;  Location: [facility] ORS;  Service: General;  Laterality: Left;   KNEE ARTHROSCOPY Left 06/16/2020   Procedure: ARTHROSCOPY KNEE LEFT;  Surgeon: Arlyne Lame, MD;  Location: [facility] ORS;  Service: Orthopedics;  Laterality: Left;   SHOULDER SURGERY     TONSILLECTOMY     WISDOM TOOTH EXTRACTION     Patient Active Problem List   Diagnosis Date Noted   Exposure to COVID-19 virus 05/05/2022   COVID-19 05/05/2022   Cervical spondylosis without myelopathy 10/24/2014   Cervical nerve root disorder 10/24/2014   Degeneration of cervical intervertebral disc 10/24/2014   Sebaceous cyst 02/06/2014   ONSET DATE: 03/03/2023  REFERRING DIAG: Cervicalgia, numbness/tingling, RUE weakness, decreased dexterity  THERAPY DIAG:  Muscle weakness (generalized)  Other lack of coordination  Rationale for Evaluation and Treatment: Rehabilitation  SUBJECTIVE: SUBJECTIVE  STATEMENT: Pt reports doing well this morning. Pt accompanied by: self  PERTINENT HISTORY:  Pt. is a 73 y.o. male who was referred for OT services 2/2 decreased right hand strength, with decreased right hand dexterity over the past several months, as well as numbness and tingling in the Left. Pt. presents with Right thumb, and 2nd digit weakness with atrophy at the right 1st dorsal interossei, and thenar eminence. History of cervicalgia, 1/10 scapular pain radiating down to the Left elbow. (Of note Pt. reports approximately 15 years ago, waking up with a popping-like pain in the neck with immediate onset of 4th, and 5th digit numbness) Pt. Is currently undergoing work-up, and imaging. Pt. PMHx includes: Cervical spondylosis without myelopathy, Cervical nerve root disorder, Degeneration of cervical intervertebral disc, COVID-19, Sabaceous cyst  PRECAUTIONS: None  WEIGHT BEARING RESTRICTIONS: No  PAIN: 09/14/23: no pain reported this date Are you having pain? Mild pain in back   FALLS: Has patient fallen in last 6 months? 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  HAND DOMINANCE: Right  ADLs: Transfers/ambulation related to ADLs: Eating: Reports Clumsy using utensil,using a fork to spear food, Difficulty with cutting food. Difficulty holding a larger glass Grooming: Adapted, uses left to pump  lotion, and squeeze the end of the toothpaste. Brushes teeth with the right hand UB Dressing: decreased strength to hold zipper, fatigues with buttons, increased  time. LB Dressing: tying shoes-loose, unable if its cold. Independent donning pants, socks. Difficulty with the belt, and pant  button. Toileting: Independent Bathing: loses control on the soap Tub Shower transfers: Independent  IADLs: Shopping: more time adapted the Light housekeeping:  More difficulty with the fitted sheet, holding dishes. Meal Prep: Cutting  Community mobility: Independent Medication management:  uses left hand the opposite way to open Financial management: No changes, check writing takes longer Handwriting: 75% legible printing, 50% cursive Hobbies: Golfing, working in the yard, fixing the house Work:  MOBILITY STATUS: Independent  POSTURE COMMENTS:  No Significant postural limitations Sitting balance:  Intact  ACTIVITY TOLERANCE: Activity tolerance:  good  FUNCTIONAL OUTCOME MEASURES: MAM-20: 66/80 sum score: 63.3 MAM Measure score  UPPER EXTREMITY ROM:    Active ROM Right eval Left Eval [facility]  Shoulder flexion 112(128)   Shoulder abduction 72(110)   Shoulder adduction    Shoulder extension    Shoulder internal rotation    Shoulder external rotation    Elbow flexion WFL   Elbow extension WFL   Wrist flexion WFL   Wrist extension WFL   Wrist ulnar deviation    Wrist radial deviation    Wrist pronation    Wrist supination    (Blank rows = not tested)  Thumb opposition: Right:  to the tip of the 4th digit. Pt. Is able to make a full composite fist.  UPPER EXTREMITY MMT:     MMT Right eval Left Eval 5/5  Shoulder flexion 4-/5   Shoulder abduction 3-/5   Shoulder adduction    Shoulder extension    Shoulder internal rotation    Shoulder external rotation    Middle trapezius    Lower trapezius    Elbow flexion 5/5   Elbow extension 5/5   Wrist flexion 5/5   Wrist extension 5/5   Wrist ulnar deviation    Wrist radial deviation    Wrist pronation    Wrist supination    (Blank rows = not tested)  HAND FUNCTION: Eval: Grip strength: Right: 62 lbs; Left: 83 lbs, Lateral pinch: Right: 7 lbs, Left: 20 lbs, 3 point pinch: Right: 6 lbs, Left: 11 lbs, and Tip  pinch: Right 7 lbs, Left: 13 lbs 08/15/23: Grip strength: Right: 63>71>60 lbs (3 trials), L 87 lbs, Lateral pinch: Right: 4 lbs (Saehan pinch gauge-standard used at eval, but not available today), Left 19 lbs; 3 point pinch: Right: 5 lbs, Left: 17 lbs, and tip pinch: Right: 5 lbs, Left: 7 lbs  COORDINATION: 9 Hole Peg test: Right: 33 sec; Left: 28 sec  SENSATION: Light touch: WFL Proprioception: WFL  EDEMA: N/A  MUSCLE TONE: atrophy at the right 1st dorsal interossei, thenar eminence  COGNITION: Overall cognitive status: Within functional limits for tasks assessed  VISION: Subjective report: No change from baseline  PERCEPTION: WFL  PRAXIS: [facility]  TREATMENT DATE: 09/05/2023  Therapeutic Exercises:   -Pink theraputty review for digit opposition, thumb flex, lateral and 3 point pinching with min vc for technique -R grip strengthening with hand gripper set at 28.9# for a 3 trials to remove jumbo pegs from pegboard.   -Self passive wrist and digit ext stretch with elbow extended following grip strengthening noted above  Therapeutic Activity: -Facilitated R hand combination movements to target simultaneous wrist and hand strength, working with putty tools and pink putty, including knob turn, cap turn, peg turn, and key turn attachments. -Facilitated R hand FMC/dexterity skills:  Transport planner and disassembly of [employer] Peg pieces working with pegs, washers, and spacers, using R hand with and without narrow head tweezers   PATIENT EDUCATION: Education details: [facility] exercises Person educated: Patient Education method: Handouts Education comprehension: verbalized understanding  HOME EXERCISE PROGRAM: Pink theraputty, cane stretches, yellow theraband, FMC exercise handout  GOALS: Goals reviewed with patient? Yes  SHORT TERM GOALS: Target date: 09/05/2023    Pt.will  be independent with HEPs for the RUE, and hand Baseline: Eval: No current HEP Goal status: INITIAL   LONG TERM GOALS: Target date: 10/17/2023   Pt. Will increase RUE shoulder  ROM by 10 degrees to improve RUE functional reach. Baseline: Eval: Right shoulder flexion: 112(128), Abduction: 72(110)  Goal status: INITIAL  2.  Pt. Will  increase right grip strength by 5# to be able to securely turn a doorknob. Baseline: Eval: Right: 62#, Left: 83# Goal status: INITIAL  3.  Pt. Will improve right lateral pinch strength by 5# to assist with fork use during meals.  Baseline: Eval: Right: 7#, Left: 20# Goal status: INITIAL  4.  Pt. Will improve pinch strength by 3# to be able to open packets/packages Baseline: Eval: 3pt. Pinch: R: 6#, L: 11#; 2pt. Pinch: R: 7#, L: 13# Goal status: INITIAL  5.  Pt. Will improve right hand [facility] skills by 3 sec. To be able to button a shirt efficiently.  Baseline:  Eval: 9 hole peg test: Right: 33 sec., L: 28 sec. Goal status: INITIAL  6.  Pt. Will write a sentence legibly, and efficiently. Baseline: Eval: Name only: 75% legibility for printed form, 50% legibility for cursive form Goal status: INITIAL  ASSESSMENT:  CLINICAL IMPRESSION: Pt continues to report improvements in R hand strength, and was able to tolerate 3 full trials of hand gripper and peg activity with gripper set at 28.9#.  Pt continues to present with mild tremor in R hand near end of session after high reps of gripping and pinching exercises; assumed muscle fatigue as this appears near end of session.  Pt is able to demo improved form with theraputty exercises with reduced compensation when engaging the thumb.  Pt. will continue to benefit from OT services to work on improving RUE functioning in order to increase RUE engagement in, and maximize performance during ADLs and IADL tasks.   PERFORMANCE DEFICITS: in functional skills including ADLs, IADLs, coordination, dexterity, proprioception,  ROM, strength, pain, Fine motor control, Gross motor control, decreased knowledge of use of DME, and UE functional use, cognitive skills including , and psychosocial skills including environmental adaptation and routines and behaviors.   IMPAIRMENTS: are limiting patient from ADLs, IADLs, education, work, and leisure.   CO-MORBIDITIES: may have co-morbidities  that affects occupational performance. Patient will benefit from skilled OT to address above impairments and improve overall function.  MODIFICATION OR ASSISTANCE TO COMPLETE EVALUATION: Min-Moderate modification of tasks or assist with assess necessary to  complete an evaluation.  OT OCCUPATIONAL PROFILE AND HISTORY: Detailed assessment: Review of records and additional review of physical, cognitive, psychosocial history related to current functional performance.  CLINICAL DECISION MAKING: Moderate - several treatment options, min-mod task modification necessary  REHAB POTENTIAL: Good  EVALUATION COMPLEXITY: Moderate    PLAN:  OT FREQUENCY: 2x/week  OT DURATION: 12 weeks  PLANNED INTERVENTIONS: 97168 OT Re-evaluation, 97535 self care/ADL training, 16109 therapeutic exercise, 97530 therapeutic activity, 97112 neuromuscular re-education, 97140 manual therapy, 97018 paraffin, 60454 moist heat, 97034 contrast bath, 97033 iontophoresis, 97760 Splinting (initial encounter), [id number] Subsequent splinting/medication, passive range of motion, visual/perceptual remediation/compensation, patient/family education, and DME and/or AE instructions  RECOMMENDED OTHER SERVICES: PT  CONSULTED AND AGREED WITH PLAN OF CARE: Patient  PLAN FOR NEXT SESSION: review HEP  Marcus Sewer, MS, OTR/L  09/14/2023, 11:13 AM

## 2023-09-19 ENCOUNTER — Ambulatory Visit

## 2023-09-19 DIAGNOSIS — M6281 Muscle weakness (generalized): Secondary | ICD-10-CM | POA: Diagnosis not present

## 2023-09-19 DIAGNOSIS — R278 Other lack of coordination: Secondary | ICD-10-CM

## 2023-09-19 NOTE — Therapy (Signed)
 OUTPATIENT OCCUPATIONAL THERAPY NEURO TREATMENT  Patient Name: Dennis Barrera MRN: [id number] DOB:August 11, 1950, 73 y.o., male Today's Date: 09/19/2023  PCP: Gustavo Leigh, MD REFERRING PROVIDER: Cullen Dose, MD  END OF SESSION:  OT End of Session - 09/19/23 0858     Visit Number 9    Number of Visits 24    Date for OT Re-Evaluation 10/17/23    Authorization Type Progress reporting period starting 07/25/23    OT Start Time 0800    OT Stop Time 0845    OT Time Calculation (min) 45 min    Activity Tolerance Patient tolerated treatment well    Behavior During Therapy [facility] for tasks assessed/performed             No past medical history on file. Past Surgical History:  Procedure Laterality Date   COLONOSCOPY WITH PROPOFOL  N/A 07/06/2016   Procedure: COLONOSCOPY WITH PROPOFOL ;  Surgeon: Luke Salaam, MD;  Location: [facility] ENDOSCOPY;  Service: Endoscopy;  Laterality: N/A;   CYST EXCISION  02/05/14   lower back   INGUINAL HERNIA REPAIR Left 01/02/2020   Procedure: HERNIA REPAIR INGUINAL ADULT;  Surgeon: Marshall Skeeter, MD;  Location: [facility] ORS;  Service: General;  Laterality: Left;   KNEE ARTHROSCOPY Left 06/16/2020   Procedure: ARTHROSCOPY KNEE LEFT;  Surgeon: Arlyne Lame, MD;  Location: [facility] ORS;  Service: Orthopedics;  Laterality: Left;   SHOULDER SURGERY     TONSILLECTOMY     WISDOM TOOTH EXTRACTION     Patient Active Problem List   Diagnosis Date Noted   Exposure to COVID-19 virus 05/05/2022   COVID-19 05/05/2022   Cervical spondylosis without myelopathy 10/24/2014   Cervical nerve root disorder 10/24/2014   Degeneration of cervical intervertebral disc 10/24/2014   Sebaceous cyst 02/06/2014   ONSET DATE: 03/03/2023  REFERRING DIAG: Cervicalgia, numbness/tingling, RUE weakness, decreased dexterity  THERAPY DIAG:  Muscle weakness (generalized)  Other lack of coordination  Rationale for Evaluation and Treatment: Rehabilitation  SUBJECTIVE: SUBJECTIVE  STATEMENT: Pt reports doing well today. Pt accompanied by: self  PERTINENT HISTORY:  Pt. is a 73 y.o. male who was referred for OT services 2/2 decreased right hand strength, with decreased right hand dexterity over the past several months, as well as numbness and tingling in the Left. Pt. presents with Right thumb, and 2nd digit weakness with atrophy at the right 1st dorsal interossei, and thenar eminence. History of cervicalgia, 1/10 scapular pain radiating down to the Left elbow. (Of note Pt. reports approximately 15 years ago, waking up with a popping-like pain in the neck with immediate onset of 4th, and 5th digit numbness) Pt. Is currently undergoing work-up, and imaging. Pt. PMHx includes: Cervical spondylosis without myelopathy, Cervical nerve root disorder, Degeneration of cervical intervertebral disc, COVID-19, Sabaceous cyst  PRECAUTIONS: None  WEIGHT BEARING RESTRICTIONS: No  PAIN: 09/19/23: no pain reported this date Are you having pain? Mild pain in back   FALLS: Has patient fallen in last 6 months? 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  HAND DOMINANCE: Right  ADLs: Transfers/ambulation related to ADLs: Eating: Reports Clumsy using utensil,using a fork to spear food, Difficulty with cutting food. Difficulty holding a larger glass Grooming: Adapted, uses left to pump lotion,  and squeeze the end of the toothpaste. Brushes teeth with the right hand UB Dressing: decreased strength to hold zipper, fatigues with buttons, increased  time. LB Dressing: tying shoes-loose, unable if its cold. Independent donning pants, socks. Difficulty with the belt, and pant  button. Toileting: Independent Bathing: loses control on the soap Tub Shower transfers: Independent  IADLs: Shopping: more time adapted the Light housekeeping:  More difficulty with the fitted sheet, holding dishes. Meal Prep: Cutting  Community mobility: Independent Medication management:  uses left hand the opposite way to open Financial management: No changes, check writing takes longer Handwriting: 75% legible printing, 50% cursive Hobbies: Golfing, working in the yard, fixing the house Work:  MOBILITY STATUS: Independent  POSTURE COMMENTS:  No Significant postural limitations Sitting balance:  Intact  ACTIVITY TOLERANCE: Activity tolerance:  good  FUNCTIONAL OUTCOME MEASURES: MAM-20: 66/80 sum score: 63.3 MAM Measure score  UPPER EXTREMITY ROM:    Active ROM Right eval Left Eval [facility]  Shoulder flexion 112(128)   Shoulder abduction 72(110)   Shoulder adduction    Shoulder extension    Shoulder internal rotation    Shoulder external rotation    Elbow flexion WFL   Elbow extension WFL   Wrist flexion WFL   Wrist extension WFL   Wrist ulnar deviation    Wrist radial deviation    Wrist pronation    Wrist supination    (Blank rows = not tested)  Thumb opposition: Right:  to the tip of the 4th digit. Pt. Is able to make a full composite fist.  UPPER EXTREMITY MMT:     MMT Right eval Left Eval 5/5  Shoulder flexion 4-/5   Shoulder abduction 3-/5   Shoulder adduction    Shoulder extension    Shoulder internal rotation    Shoulder external rotation    Middle trapezius    Lower trapezius    Elbow flexion 5/5   Elbow extension 5/5   Wrist flexion 5/5   Wrist extension 5/5   Wrist ulnar deviation    Wrist radial deviation    Wrist pronation    Wrist supination    (Blank rows = not tested)  HAND FUNCTION: Eval: Grip strength: Right: 62 lbs; Left: 83 lbs, Lateral pinch: Right: 7 lbs, Left: 20 lbs, 3 point pinch: Right: 6 lbs, Left: 11 lbs, and Tip  pinch: Right 7 lbs, Left: 13 lbs 08/15/23: Grip strength: Right: 63>71>60 lbs (3 trials), L 87 lbs, Lateral pinch: Right: 4 lbs (Saehan pinch gauge-standard used at eval, but not available today), Left 19 lbs; 3 point pinch: Right: 5 lbs, Left: 17 lbs, and tip pinch: Right: 5 lbs, Left: 7 lbs  COORDINATION: 9 Hole Peg test: Right: 33 sec; Left: 28 sec  SENSATION: Light touch: WFL Proprioception: WFL  EDEMA: N/A  MUSCLE TONE: atrophy at the right 1st dorsal interossei, thenar eminence  COGNITION: Overall cognitive status: Within functional limits for tasks assessed  VISION: Subjective report: No change from baseline  PERCEPTION: WFL  PRAXIS: [facility]  TREATMENT DATE: 09/19/2023  Therapeutic Exercises:   -Upgraded resistance with putty (green) and completed gross grasping, digit opposition, thumb flex, lateral and 3 point pinching; constant monitoring for tolerance -Self passive wrist and digit ext stretch with elbow extended following putty exercises -Facilitated lateral and 3 point pinch strengthening with green, blue, and black therapy resistant clothespins  Therapeutic Activity: -Facilitated R hand combination movements to target simultaneous wrist and hand strength working with putty tools and pink putty, including knob turn, cap turn, peg turn, and key turn attachments. -Facilitated R hand FMC/dexterity skills: Pt placed/removed Jamar pegs with and without tweezers into pegboard  PATIENT EDUCATION: Education details: R Geneticist, molecular Person educated: Patient Education method: Handouts Education comprehension: verbalized understanding, demonstrated understanding  HOME EXERCISE PROGRAM: Pink theraputty, cane stretches, yellow theraband, FMC exercise handout  GOALS: Goals reviewed with patient? Yes  SHORT TERM GOALS: Target date: 09/05/2023    Pt.will  be independent with HEPs for the RUE, and hand Baseline: Eval: No current HEP Goal status: INITIAL  LONG TERM GOALS: Target date: 10/17/2023   Pt. Will increase RUE shoulder  ROM by 10 degrees to improve RUE functional reach. Baseline: Eval: Right shoulder flexion: 112(128), Abduction: 72(110)  Goal status: INITIAL  2.  Pt. Will  increase right grip strength by 5# to be able to securely turn a doorknob. Baseline: Eval: Right: 62#, Left: 83# Goal status: INITIAL  3.  Pt. Will improve right lateral pinch strength by 5# to assist with fork use during meals.  Baseline: Eval: Right: 7#, Left: 20# Goal status: INITIAL  4.  Pt. Will improve pinch strength by 3# to be able to open packets/packages Baseline: Eval: 3pt. Pinch: R: 6#, L: 11#; 2pt. Pinch: R: 7#, L: 13# Goal status: INITIAL  5.  Pt. Will improve right hand [facility] skills by 3 sec. To be able to button a shirt efficiently.  Baseline:  Eval: 9 hole peg test: Right: 33 sec., L: 28 sec. Goal status: INITIAL  6.  Pt. Will write a sentence legibly, and efficiently. Baseline: Eval: Name only: 75% legibility for printed form, 50% legibility for cursive form Goal status: INITIAL  ASSESSMENT:  CLINICAL IMPRESSION: Pt with good tolerance to therapeutic exercises and activities this date, given intermittent rest breaks and stretching d/t R hand fatigue.  Mild shaking in R hand nearing end of session while performing FMC/dexterity skills 2/2 hand fatigue.  Pt struggled to close narrow base tweezers to grasp pegs by end of session, but observed increased hand fatigue after upgrading to green putty, working with only moderate and strong resistance clothespins, and putty tools.  Pt. will continue to benefit from OT services to work on improving RUE functioning in order to increase RUE engagement in, and maximize performance during ADLs and IADL tasks.   PERFORMANCE DEFICITS: in functional skills including ADLs, IADLs, coordination, dexterity,  proprioception, ROM, strength, pain, Fine motor control, Gross motor control, decreased knowledge of use of DME, and UE functional use, cognitive skills including , and psychosocial skills including environmental adaptation and routines and behaviors.   IMPAIRMENTS: are limiting patient from ADLs, IADLs, education, work, and leisure.   CO-MORBIDITIES: may have co-morbidities  that affects occupational performance. Patient will benefit from skilled OT to address above impairments and improve overall function.  MODIFICATION OR ASSISTANCE TO COMPLETE EVALUATION: Min-Moderate modification of tasks or assist with assess necessary to complete an evaluation.  OT OCCUPATIONAL PROFILE AND HISTORY: Detailed assessment: Review of records and additional review of physical, cognitive, psychosocial  history related to current functional performance.  CLINICAL DECISION MAKING: Moderate - several treatment options, min-mod task modification necessary  REHAB POTENTIAL: Good  EVALUATION COMPLEXITY: Moderate    PLAN:  OT FREQUENCY: 2x/week  OT DURATION: 12 weeks  PLANNED INTERVENTIONS: 97168 OT Re-evaluation, 97535 self care/ADL training, 65784 therapeutic exercise, 97530 therapeutic activity, 97112 neuromuscular re-education, 97140 manual therapy, 97018 paraffin, 69629 moist heat, 97034 contrast bath, 97033 iontophoresis, 97760 Splinting (initial encounter), [id number] Subsequent splinting/medication, passive range of motion, visual/perceptual remediation/compensation, patient/family education, and DME and/or AE instructions  RECOMMENDED OTHER SERVICES: PT  CONSULTED AND AGREED WITH PLAN OF CARE: Patient  PLAN FOR NEXT SESSION: review HEP  Marcus Sewer, MS, OTR/L  09/19/2023, 8:59 AM

## 2023-09-27 ENCOUNTER — Ambulatory Visit

## 2023-09-27 DIAGNOSIS — M6281 Muscle weakness (generalized): Secondary | ICD-10-CM

## 2023-09-27 DIAGNOSIS — R278 Other lack of coordination: Secondary | ICD-10-CM | POA: Diagnosis not present

## 2023-09-27 NOTE — Therapy (Unsigned)
 OUTPATIENT OCCUPATIONAL THERAPY PROGRESS AND NEURO TREATMENT NOTE Reporting period beginning 07/25/23-09/27/23  Patient Name: Dennis Barrera MRN: [id number] DOB:1950/06/08, 73 y.o., male Today's Date: 09/27/2023  PCP: Gustavo Leigh, MD REFERRING PROVIDER: Cullen Dose, MD  END OF SESSION:  OT End of Session - 09/27/23 0939     Visit Number 10    Number of Visits 24    Date for OT Re-Evaluation 10/17/23    Authorization Type Progress reporting period starting 07/25/23    OT Start Time 0930    OT Stop Time 1015    OT Time Calculation (min) 45 min            No past medical history on file. Past Surgical History:  Procedure Laterality Date   COLONOSCOPY WITH PROPOFOL  N/A 07/06/2016   Procedure: COLONOSCOPY WITH PROPOFOL ;  Surgeon: Luke Salaam, MD;  Location: [facility];  Service: Endoscopy;  Laterality: N/A;   CYST EXCISION  02/05/14   lower back   INGUINAL HERNIA REPAIR Left 01/02/2020   Procedure: HERNIA REPAIR INGUINAL ADULT;  Surgeon: Marshall Skeeter, MD;  Location: [facility];  Service: General;  Laterality: Left;   KNEE ARTHROSCOPY Left 06/16/2020   Procedure: ARTHROSCOPY KNEE LEFT;  Surgeon: Arlyne Lame, MD;  Location: [facility];  Service: Orthopedics;  Laterality: Left;   SHOULDER SURGERY     TONSILLECTOMY     WISDOM TOOTH EXTRACTION     Patient Active Problem List   Diagnosis Date Noted   Exposure to COVID-19 virus 05/05/2022   COVID-19 05/05/2022   Cervical spondylosis without myelopathy 10/24/2014   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 Pt accompanied by:  self  PERTINENT HISTORY:  Pt. is a 73 y.o. male who was referred for OT services 2/2 decreased right hand strength, with decreased right hand dexterity over the past several months, as well as numbness and tingling in the Left. Pt. presents with Right thumb, and 2nd digit weakness with atrophy at the right 1st dorsal interossei, and thenar eminence. History of cervicalgia, 1/10 scapular pain radiating down to the Left elbow. (Of note Pt. reports approximately 15 years ago, waking up with a popping-like pain in the neck with immediate onset of 4th, and 5th digit numbness) Pt. Is currently undergoing work-up, and imaging. Pt. PMHx includes: Cervical spondylosis without myelopathy, Cervical nerve root disorder, Degeneration of cervical intervertebral disc, COVID-19, Sabaceous cyst  PRECAUTIONS: None  WEIGHT BEARING RESTRICTIONS: No  PAIN: 09/27/23: no pain reported this date, mild stiffness in the R anterior shoulder/pec Are you having pain? Mild pain in back   FALLS: Has patient fallen in last 6 months? 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  HAND DOMINANCE: Right  ADLs: Transfers/ambulation related to ADLs: Eating: Reports Clumsy using utensil,using a fork to spear food, Difficulty with cutting food. Difficulty holding a larger glass Grooming: Adapted, uses left to pump lotion, and squeeze the end of the  toothpaste. Brushes teeth with the right hand UB Dressing: decreased strength to hold zipper, fatigues with buttons, increased  time. LB Dressing: tying shoes-loose, unable if its cold. Independent donning pants, socks. Difficulty with the belt, and pant button. Toileting:  Independent Bathing: loses control on the soap Tub Shower transfers: Independent  IADLs: Shopping: more time adapted the Light housekeeping:  More difficulty with the fitted sheet, holding dishes. Meal Prep: Cutting  Community mobility: Independent Medication management:  uses left hand the opposite way to open Financial management: No changes, check writing takes longer Handwriting: 75% legible printing, 50% cursive Hobbies: Golfing, working in the yard, fixing the house Work:  MOBILITY STATUS: Independent  POSTURE COMMENTS:  No Significant postural limitations Sitting balance:  Intact  ACTIVITY TOLERANCE: Activity tolerance:  good  FUNCTIONAL OUTCOME MEASURES: MAM-20: 66/80 sum score: 63.3 MAM Measure score 09/27/23: 64/80  UPPER EXTREMITY ROM:    Active ROM Right eval 09/27/23 Left Eval [facility]  Shoulder flexion 112(128) 131 (138)   Shoulder abduction 72(110) 110 (110)   Shoulder adduction     Shoulder extension     Shoulder internal rotation     Shoulder external rotation     Elbow flexion WFL    Elbow extension WFL    Wrist flexion WFL    Wrist extension WFL    Wrist ulnar deviation     Wrist radial deviation     Wrist pronation     Wrist supination     (Blank rows = not tested)  Thumb opposition: Right:  to the tip of the 4th digit. Pt. Is able to make a full composite fist.  09/27/23: Able to oppose R hand digits to 5th with effort (thumb to lateral side of 5th digit, unable to reach tip)  UPPER EXTREMITY MMT:     MMT Right eval Right 09/27/23 Left Eval 5/5  Shoulder flexion 4-/5 4/5   Shoulder abduction 3-/5 4/5    Shoulder adduction     Shoulder extension     Shoulder internal rotation     Shoulder external rotation     Middle trapezius     Lower trapezius     Elbow flexion 5/5    Elbow extension 5/5    Wrist flexion 5/5    Wrist extension 5/5    Wrist ulnar deviation     Wrist radial deviation     Wrist pronation     Wrist supination      (Blank rows = not tested)  HAND FUNCTION: Eval: Grip strength: Right: 62 lbs; Left: 83 lbs, Lateral pinch: Right: 7 lbs, Left: 20 lbs, 3 point pinch: Right: 6 lbs, Left: 11 lbs, and Tip pinch: Right 7 lbs, Left: 13 lbs 08/15/23: Grip strength: Right: 63>71>60 lbs (3 trials), L 87 lbs, Lateral pinch: Right: 4 lbs (Saehan pinch gauge-standard used at eval, but not available today), Left 19 lbs; 3 point pinch: Right: 5 lbs, Left: 17 lbs, and tip pinch: Right: 5 lbs, Left: 7 lbs 09/27/23: Grip strength: Right: 72 lbs, lateral pinch: 9 lbs (standard pinch gauge), 3 point pinch: 7 lbs (standard pinch gauge)  COORDINATION: 9 Hole Peg test: Right: 33 sec; Left: 28 sec 09/27/23: Right: 28 sec   SENSATION: Light touch: WFL Proprioception: WFL  EDEMA: N/A  MUSCLE TONE: atrophy at the right 1st dorsal interossei, thenar eminence  COGNITION: Overall cognitive status: Within functional limits for tasks assessed  VISION: Subjective report: No change from baseline  PERCEPTION: WFL  PRAXIS: [facility]  TREATMENT DATE: 09/27/2023  Therapeutic Activity: Objective measures taken and goals updated and reviewed for progress note.  Therapeutic Exercises:   -R grip strengthening: Hand gripper set at 28.9# to remove jumbo pegs from pegboard x1 trial (limited to 1 trial to ensure time for reassessments) -HEP review: encouraged theraband exercises/cane stretches be performed in front of mirror for visual feedback to maintain proper form technique;  vc to avoid trunk rotation during shoulder abd/keep shoulders forward facing -Reviewed R thumb self passive stretching to promote abd/add/full opposition to 5th digit/thumb flex/ext; good return demo  PATIENT EDUCATION: Education details: progress towards goals, HEP review Person educated: Patient Education method: Explanation and Verbal  cues Education comprehension: verbalized understanding, demonstrated understanding  HOME EXERCISE PROGRAM: Pink theraputty, cane stretches, yellow theraband, FMC exercise handout  GOALS: Goals reviewed with patient? Yes  SHORT TERM GOALS: Target date: 09/05/2023    Pt.will be independent with HEPs for the RUE, and hand Baseline: Eval: No current HEP; 09/27/23; indep putty; review of theraband (form with shd abd) and indep with good return demo after review Goal status: achieved  LONG TERM GOALS: Target date: 10/17/2023   Pt. Will increase RUE shoulder  ROM by 10 degrees to improve RUE functional reach. Baseline: Eval: Right shoulder flexion: 112(128), Abduction: 72(110); 5/27/2; 09/27/23: Right shoulder flexion: 131 (138), Abduction: 110 (110) Goal status: achieved  2.  Pt. will increase right grip strength by 15# (revised from 5# on 09/27/23) to be able to securely hold and transport ADL supplies in R dominant hand. Baseline: Eval: Right: 62#, Left: 83#; 09/27/23: Right: 72#  Goal status: revised/ongoing  3.  Pt. Will improve right lateral pinch strength by 5# to assist with fork use during meals.  Baseline: Eval: Right: 7#, Left: 20#; 09/27/23: Right: 9# Goal status: in progress  4.  Pt. Will improve 3 point pinch strength by 3# to be able to open packets/packages Baseline: Eval: 3pt. Pinch: R: 6#, L: 11#; 2pt. Pinch: R: 7#, L: 13#; 09/27/23: R 7# Goal status: INITIAL  5.  Pt. Will improve right hand [facility] skills by 3 sec. To be able to button a shirt efficiently.  Baseline:  Eval: 9 hole peg test: Right: 33 sec., L: 28 sec; 09/27/23: R 28 sec  Goal status: achieved  6.  Pt. Will write a sentence legibly, and efficiently. Baseline: Eval: Name only: 75% legibility for printed form, 50% legibility for cursive form; 09/27/23: 100% legibility and pt reports good efficiency using wide pen grip Goal status: achieved  ASSESSMENT:  CLINICAL IMPRESSION: Pt seen for 10th visit progress  update.  R shoulder flexibility and strength measures all improved with related goal met.   R grip and pinch measures steadily improving, though still weak when comparing R dominant side to L, and functionally pt demonstrates decreased tolerance for sustained gripping and pinching as noted by mild muscle tremor after high repetitions.  Pt continues to have some difficulty with opening tight containers, cutting meat, and fastening buttons.  Pt. will continue to benefit from OT services to work towards continued R hand strengthening and FMC to achieve unmet OT goals noted above.  PERFORMANCE DEFICITS: in functional skills including ADLs, IADLs, coordination, dexterity, proprioception, ROM, strength, pain, Fine motor control, Gross motor control, decreased knowledge of use of DME, and UE functional use, cognitive skills including , and psychosocial skills including environmental adaptation and routines and behaviors.   IMPAIRMENTS: are limiting patient from ADLs, IADLs, education, work, and leisure.   CO-MORBIDITIES: may have co-morbidities  that affects occupational performance. Patient will benefit from skilled OT to address above impairments and improve overall function.  MODIFICATION OR ASSISTANCE TO COMPLETE EVALUATION: Min-Moderate modification of tasks or assist with assess necessary to complete an evaluation.  OT OCCUPATIONAL PROFILE AND HISTORY: Detailed assessment: Review of records and additional review of physical, cognitive, psychosocial history related to current functional performance.  CLINICAL DECISION MAKING: Moderate - several treatment options, min-mod task modification necessary  REHAB POTENTIAL: Good  EVALUATION COMPLEXITY: Moderate    PLAN:  OT FREQUENCY: 2x/week  OT DURATION: 12 weeks  PLANNED INTERVENTIONS: 97168 OT Re-evaluation, 97535 self care/ADL training, 16109 therapeutic exercise, 97530 therapeutic activity, 97112 neuromuscular re-education, 97140 manual  therapy, 97018 paraffin, 60454 moist heat, 97034 contrast bath, 97033 iontophoresis, 97760 Splinting (initial encounter), [id number] Subsequent splinting/medication, passive range of motion, visual/perceptual remediation/compensation, patient/family education, and DME and/or AE instructions  RECOMMENDED OTHER SERVICES: PT  CONSULTED AND AGREED WITH PLAN OF CARE: Patient  PLAN FOR NEXT SESSION: R hand strengthening/coordination training   Marcus Sewer, MS, OTR/L  09/27/2023, 9:41 AM

## 2023-10-03 ENCOUNTER — Ambulatory Visit: Attending: Neurology

## 2023-10-03 DIAGNOSIS — R278 Other lack of coordination: Secondary | ICD-10-CM | POA: Diagnosis not present

## 2023-10-03 DIAGNOSIS — M6281 Muscle weakness (generalized): Secondary | ICD-10-CM | POA: Insufficient documentation

## 2023-10-03 NOTE — Therapy (Unsigned)
 OUTPATIENT OCCUPATIONAL THERAPY PROGRESS AND NEURO TREATMENT NOTE Reporting period beginning 07/25/23-09/27/23  Patient Name: Dennis Barrera MRN: [id number] DOB:[birth year], 73 y.o., male Today's Date: 10/03/2023  PCP: Gustavo Leigh, MD REFERRING PROVIDER: Cullen Dose, MD  END OF SESSION:   No past medical history on file. Past Surgical History:  Procedure Laterality Date   COLONOSCOPY WITH PROPOFOL  N/A 07/06/2016   Procedure: COLONOSCOPY WITH PROPOFOL ;  Surgeon: Luke Salaam, MD;  Location: [facility];  Service: Endoscopy;  Laterality: N/A;   CYST EXCISION  02/05/14   lower back   INGUINAL HERNIA REPAIR Left 01/02/2020   Procedure: HERNIA REPAIR INGUINAL ADULT;  Surgeon: Marshall Skeeter, MD;  Location: [facility];  Service: General;  Laterality: Left;   KNEE ARTHROSCOPY Left 06/16/2020   Procedure: ARTHROSCOPY KNEE LEFT;  Surgeon: Arlyne Lame, MD;  Location: [facility];  Service: Orthopedics;  Laterality: Left;   SHOULDER SURGERY     TONSILLECTOMY     WISDOM TOOTH EXTRACTION     Patient Active Problem List   Diagnosis Date Noted   Exposure to COVID-19 virus 05/05/2022   COVID-19 05/05/2022   Cervical spondylosis without myelopathy 10/24/2014   Cervical nerve root disorder 10/24/2014   Degeneration of cervical intervertebral disc 10/24/2014   Sebaceous cyst 02/06/2014   ONSET DATE: 03/03/2023  REFERRING DIAG: Cervicalgia, numbness/tingling, RUE weakness, decreased dexterity  THERAPY DIAG:  No diagnosis found.  Rationale for Evaluation and Treatment: Rehabilitation  SUBJECTIVE: SUBJECTIVE STATEMENT: Pt reports doing well today. Pt accompanied by: self  PERTINENT HISTORY:  Pt. is a 73 y.o. male who was referred for OT services 2/2 decreased right hand strength, with decreased right hand dexterity over the past several months, as well as numbness and tingling in the Left. Pt. presents with Right thumb, and 2nd digit weakness with atrophy at the right 1st dorsal  interossei, and thenar eminence. History of cervicalgia, 1/10 scapular pain radiating down to the Left elbow. (Of note Pt. reports approximately 15 years ago, waking up with a popping-like pain in the neck with immediate onset of 4th, and 5th digit numbness) Pt. Is currently undergoing work-up, and imaging. Pt. PMHx includes: Cervical spondylosis without myelopathy, Cervical nerve root disorder, Degeneration of cervical intervertebral disc, COVID-19, Sabaceous cyst  PRECAUTIONS: None  WEIGHT BEARING RESTRICTIONS: No  PAIN: 10/03/23: no pain reported this date, mild stiffness in the R anterior shoulder/pec Are you having pain? Mild pain in back   FALLS: Has patient fallen in last 6 months? 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  HAND DOMINANCE: Right  ADLs: Transfers/ambulation related to ADLs: Eating: Reports Clumsy using utensil,using a fork to spear food, Difficulty with cutting food. Difficulty holding a larger glass Grooming: Adapted, uses left to pump lotion, and squeeze the end of the toothpaste. Brushes teeth with the right hand UB Dressing: decreased strength to hold zipper, fatigues with buttons, increased  time. LB Dressing: tying shoes-loose, unable if its cold. Independent donning pants, socks. Difficulty with the belt, and pant button. Toileting: Independent Bathing: loses control on the soap Tub Shower transfers: Independent  IADLs: Shopping: more time adapted the Light housekeeping:  More difficulty with the fitted sheet, holding dishes. Meal Prep: Cutting  Community mobility: Independent Medication  management:  uses left hand the opposite way to  open Financial management: No changes, check writing takes longer Handwriting: 75% legible printing, 50% cursive Hobbies: Golfing, working in the yard, fixing the house Work:  MOBILITY STATUS: Independent  POSTURE COMMENTS:  No Significant postural limitations Sitting balance:  Intact  ACTIVITY TOLERANCE: Activity tolerance:  good  FUNCTIONAL OUTCOME MEASURES: MAM-20: 66/80 sum score: 63.3 MAM Measure score 09/27/23: 64/80  UPPER EXTREMITY ROM:    Active ROM Right eval 09/27/23 Left Eval [facility]  Shoulder flexion 112(128) 131 (138)   Shoulder abduction 72(110) 110 (110)   Shoulder adduction     Shoulder extension     Shoulder internal rotation     Shoulder external rotation     Elbow flexion WFL    Elbow extension WFL    Wrist flexion WFL    Wrist extension WFL    Wrist ulnar deviation     Wrist radial deviation     Wrist pronation     Wrist supination     (Blank rows = not tested)  Thumb opposition: Right:  to the tip of the 4th digit. Pt. Is able to make a full composite fist.  09/27/23: Able to oppose R hand digits to 5th with effort (thumb to lateral side of 5th digit, unable to reach tip)  UPPER EXTREMITY MMT:     MMT Right eval Right 09/27/23 Left Eval 5/5  Shoulder flexion 4-/5 4/5   Shoulder abduction 3-/5 4/5    Shoulder adduction     Shoulder extension     Shoulder internal rotation     Shoulder external rotation     Middle trapezius     Lower trapezius     Elbow flexion 5/5    Elbow extension 5/5    Wrist flexion 5/5    Wrist extension 5/5    Wrist ulnar deviation     Wrist radial deviation     Wrist pronation     Wrist supination     (Blank rows = not tested)  HAND FUNCTION: Eval: Grip strength: Right: 62 lbs; Left: 83 lbs, Lateral pinch: Right: 7 lbs, Left: 20 lbs, 3 point pinch: Right: 6 lbs, Left: 11 lbs, and Tip pinch: Right 7 lbs, Left: 13 lbs 08/15/23: Grip strength: Right: 63>71>60 lbs (3 trials), L 87 lbs, Lateral pinch: Right: 4 lbs  (Saehan pinch gauge-standard used at eval, but not available today), Left 19 lbs; 3 point pinch: Right: 5 lbs, Left: 17 lbs, and tip pinch: Right: 5 lbs, Left: 7 lbs 09/27/23: Grip strength: Right: 72 lbs, lateral pinch: 9 lbs (standard pinch gauge), 3 point pinch: 7 lbs (standard pinch gauge)  COORDINATION: 9 Hole Peg test: Right: 33 sec; Left: 28 sec 09/27/23: Right: 28 sec   SENSATION: Light touch: WFL Proprioception: WFL  EDEMA: N/A  MUSCLE TONE: atrophy at the right 1st dorsal interossei, thenar eminence  COGNITION: Overall cognitive status: Within functional limits for tasks assessed  VISION: Subjective report: No change from baseline  PERCEPTION: WFL  PRAXIS: [facility]  TREATMENT DATE: 09/27/2023  Therapeutic Activity: Objective measures taken and goals updated and reviewed for progress note.  Therapeutic Exercises:   -R grip strengthening: Hand gripper set at 28.9# to remove jumbo pegs from pegboard x1 trial (limited to 1 trial to ensure time for reassessments) -HEP review: encouraged theraband exercises/cane stretches be performed in front of mirror for visual feedback to maintain proper form technique;  vc to avoid trunk rotation during shoulder abd/keep shoulders forward facing -Reviewed R thumb self passive stretching to promote abd/add/full opposition to 5th digit/thumb flex/ext; good return demo  PATIENT EDUCATION: Education details: progress towards goals, HEP review Person educated: Patient Education method: Explanation and Verbal cues Education comprehension: verbalized understanding, demonstrated understanding  HOME EXERCISE PROGRAM: Pink theraputty, cane stretches, yellow theraband, FMC exercise handout  GOALS: Goals reviewed with patient? Yes  SHORT TERM GOALS: Target date: 09/05/2023    Pt.will be independent with HEPs for the RUE, and  hand Baseline: Eval: No current HEP; 09/27/23; indep putty; review of theraband (form with shd abd) and indep with good return demo after review Goal status: achieved  LONG TERM GOALS: Target date: 10/17/2023   Pt. Will increase RUE shoulder  ROM by 10 degrees to improve RUE functional reach. Baseline: Eval: Right shoulder flexion: 112(128), Abduction: 72(110); 5/27/2; 09/27/23: Right shoulder flexion: 131 (138), Abduction: 110 (110) Goal status: achieved  2.  Pt. will increase right grip strength by 15# (revised from 5# on 09/27/23) to be able to securely hold and transport ADL supplies in R dominant hand. Baseline: Eval: Right: 62#, Left: 83#; 09/27/23: Right: 72#  Goal status: revised/ongoing  3.  Pt. Will improve right lateral pinch strength by 5# to assist with fork use during meals.  Baseline: Eval: Right: 7#, Left: 20#; 09/27/23: Right: 9# Goal status: in progress  4.  Pt. Will improve 3 point pinch strength by 3# to be able to open packets/packages Baseline: Eval: 3pt. Pinch: R: 6#, L: 11#; 2pt. Pinch: R: 7#, L: 13#; 09/27/23: R 7# Goal status: INITIAL  5.  Pt. Will improve right hand [facility] skills by 3 sec. To be able to button a shirt efficiently.  Baseline:  Eval: 9 hole peg test: Right: 33 sec., L: 28 sec; 09/27/23: R 28 sec  Goal status: achieved  6.  Pt. Will write a sentence legibly, and efficiently. Baseline: Eval: Name only: 75% legibility for printed form, 50% legibility for cursive form; 09/27/23: 100% legibility and pt reports good efficiency using wide pen grip Goal status: achieved  ASSESSMENT:  CLINICAL IMPRESSION: Pt seen for 10th visit progress update.  R shoulder flexibility and strength measures all improved with related goal met.   R grip and pinch measures steadily improving, though still weak when comparing R dominant side to L, and functionally pt demonstrates decreased tolerance for sustained gripping and pinching as noted by mild muscle tremor after high  repetitions.  Pt continues to have some difficulty with opening tight containers, cutting meat, and fastening buttons.  Pt. will continue to benefit from OT services to work towards continued R hand strengthening and FMC to achieve unmet OT goals noted above.  PERFORMANCE DEFICITS: in functional skills including ADLs, IADLs, coordination, dexterity, proprioception, ROM, strength, pain, Fine motor control, Gross motor control, decreased knowledge of use of DME, and UE functional use, cognitive skills including , and psychosocial skills including environmental adaptation and routines and behaviors.   IMPAIRMENTS: are limiting patient from ADLs, IADLs, education, work, and leisure.   CO-MORBIDITIES: may have co-morbidities  that affects occupational performance. Patient will benefit from skilled OT to address above impairments and improve overall function.  MODIFICATION OR ASSISTANCE TO COMPLETE EVALUATION: Min-Moderate modification of tasks or assist with assess necessary to complete an evaluation.  OT OCCUPATIONAL PROFILE AND HISTORY: Detailed assessment: Review of records and additional review of physical, cognitive, psychosocial history related to current functional performance.  CLINICAL DECISION MAKING: Moderate - several treatment options, min-mod task modification necessary  REHAB POTENTIAL: Good  EVALUATION COMPLEXITY: Moderate    PLAN:  OT FREQUENCY: 2x/week  OT DURATION: 12 weeks  PLANNED INTERVENTIONS: 97168 OT Re-evaluation, 97535 self care/ADL training, 16109 therapeutic exercise, 97530 therapeutic activity, 97112 neuromuscular re-education, 97140 manual therapy, 97018 paraffin, 60454 moist heat, 97034 contrast bath, 97033 iontophoresis, 97760 Splinting (initial encounter), [id number] Subsequent splinting/medication, passive range of motion, visual/perceptual remediation/compensation, patient/family education, and DME and/or AE instructions  RECOMMENDED OTHER SERVICES:  PT  CONSULTED AND AGREED WITH PLAN OF CARE: Patient  PLAN FOR NEXT SESSION: R hand strengthening/coordination training   Marcus Sewer, MS, OTR/L  10/03/2023, 10:20 AM

## 2023-10-10 ENCOUNTER — Ambulatory Visit

## 2023-10-17 ENCOUNTER — Encounter

## 2023-10-24 ENCOUNTER — Encounter

## 2023-10-31 ENCOUNTER — Encounter

## 2023-11-07 ENCOUNTER — Encounter

## 2023-11-14 ENCOUNTER — Encounter

## 2023-11-21 ENCOUNTER — Encounter

## 2023-11-28 ENCOUNTER — Encounter

## 2023-12-05 ENCOUNTER — Encounter

## 2023-12-12 ENCOUNTER — Encounter

## 2024-08-22 ENCOUNTER — Ambulatory Visit: Admitting: Dermatology
